# Patient Record
Sex: Female | Born: 1975 | Race: Black or African American | Hispanic: No | Marital: Married | Smoking: Never smoker
Health system: Southern US, Community
[De-identification: ages and names within clinical notes are randomized; demographics above are authoritative.]

## PROBLEM LIST (undated history)

## (undated) DIAGNOSIS — M1990 Unspecified osteoarthritis, unspecified site: Secondary | ICD-10-CM

## (undated) DIAGNOSIS — E7800 Pure hypercholesterolemia, unspecified: Secondary | ICD-10-CM

## (undated) DIAGNOSIS — I82409 Acute embolism and thrombosis of unspecified deep veins of unspecified lower extremity: Secondary | ICD-10-CM

## (undated) DIAGNOSIS — M545 Low back pain, unspecified: Secondary | ICD-10-CM

## (undated) DIAGNOSIS — D649 Anemia, unspecified: Secondary | ICD-10-CM

## (undated) DIAGNOSIS — G971 Other reaction to spinal and lumbar puncture: Secondary | ICD-10-CM

## (undated) DIAGNOSIS — M5431 Sciatica, right side: Secondary | ICD-10-CM

## (undated) DIAGNOSIS — I6990 Unspecified sequelae of unspecified cerebrovascular disease: Secondary | ICD-10-CM

## (undated) DIAGNOSIS — Z8669 Personal history of other diseases of the nervous system and sense organs: Secondary | ICD-10-CM

## (undated) DIAGNOSIS — K5904 Chronic idiopathic constipation: Secondary | ICD-10-CM

## (undated) DIAGNOSIS — K469 Unspecified abdominal hernia without obstruction or gangrene: Secondary | ICD-10-CM

## (undated) DIAGNOSIS — N921 Excessive and frequent menstruation with irregular cycle: Secondary | ICD-10-CM

## (undated) DIAGNOSIS — G8929 Other chronic pain: Secondary | ICD-10-CM

## (undated) DIAGNOSIS — R51 Headache: Secondary | ICD-10-CM

## (undated) DIAGNOSIS — M5126 Other intervertebral disc displacement, lumbar region: Secondary | ICD-10-CM

## (undated) DIAGNOSIS — I639 Cerebral infarction, unspecified: Secondary | ICD-10-CM

## (undated) DIAGNOSIS — K219 Gastro-esophageal reflux disease without esophagitis: Secondary | ICD-10-CM

## (undated) HISTORY — PX: ENDOMETRIAL ABLATION: SHX621

## (undated) HISTORY — PX: TONSILLECTOMY: SUR1361

---

## 1999-06-12 HISTORY — PX: ANTERIOR CRUCIATE LIGAMENT REPAIR: SHX115

## 2001-12-30 ENCOUNTER — Encounter: Payer: Self-pay | Admitting: *Deleted

## 2001-12-30 ENCOUNTER — Emergency Department (HOSPITAL_COMMUNITY): Admission: EM | Admit: 2001-12-30 | Discharge: 2001-12-30 | Payer: Self-pay | Admitting: *Deleted

## 2004-03-09 ENCOUNTER — Ambulatory Visit (HOSPITAL_COMMUNITY): Admission: RE | Admit: 2004-03-09 | Discharge: 2004-03-09 | Payer: Self-pay | Admitting: Family Medicine

## 2005-03-07 ENCOUNTER — Ambulatory Visit: Payer: Self-pay | Admitting: Family Medicine

## 2005-04-10 ENCOUNTER — Ambulatory Visit: Payer: Self-pay | Admitting: Family Medicine

## 2005-07-23 ENCOUNTER — Ambulatory Visit: Payer: Self-pay | Admitting: Family Medicine

## 2005-11-02 ENCOUNTER — Ambulatory Visit: Payer: Self-pay | Admitting: Family Medicine

## 2009-10-20 ENCOUNTER — Ambulatory Visit (HOSPITAL_COMMUNITY): Admission: RE | Admit: 2009-10-20 | Discharge: 2009-10-20 | Payer: Self-pay | Admitting: Family Medicine

## 2010-10-09 ENCOUNTER — Encounter (INDEPENDENT_AMBULATORY_CARE_PROVIDER_SITE_OTHER): Payer: Self-pay | Admitting: Obstetrics & Gynecology

## 2010-10-09 ENCOUNTER — Other Ambulatory Visit: Payer: Self-pay | Admitting: Obstetrics & Gynecology

## 2010-10-09 DIAGNOSIS — Z01419 Encounter for gynecological examination (general) (routine) without abnormal findings: Secondary | ICD-10-CM

## 2010-10-09 DIAGNOSIS — N938 Other specified abnormal uterine and vaginal bleeding: Secondary | ICD-10-CM

## 2010-10-09 DIAGNOSIS — N920 Excessive and frequent menstruation with regular cycle: Secondary | ICD-10-CM

## 2010-10-10 NOTE — Group Therapy Note (Signed)
NAMEAUREA, ARONOV                 ACCOUNT NO.:  [id number]  MEDICAL RECORD NO.:  [id number]           PATIENT TYPE:  A  LOCATION:  [facility]                   FACILITY:  [facility]  PHYSICIAN:  Allie Bossier, MD        DATE OF BIRTH:  06-Nov-1975  DATE OF SERVICE:  10/09/2010                                 CLINIC NOTE  Ms. Mattox is a 35 year old married black G4, P3, A1.  She has children ages 34, 63, and 36, and she is a self-referral here for dysmenorrhea and heavy cycles.  She says that her period began at age 26 and has been always heavy.  Her periods last anywhere from 4-6 days, but the outstandingly better periods is that she has "tremendous clots."  She says that she only wears pads that she wears the extra large overnight pads even during the day and will have to change 6-7 times per day.  PAST MEDICAL HISTORY:  Significant for, 1. Morbid obesity. 2. Reflux. 3. Umbilical hernia. 4. Hemorrhoids.  REVIEW OF SYSTEMS:  She has been married for 8 years.  Her last Pap smear was in 2010.  She has never had a mammogram.  She denies dyspareunia.  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  PAST SURGICAL HISTORY:  Tubal ligation, C-section x2, elective AB, ACL repair of her right knee.  FAMILY HISTORY:  Negative for breast and GYN malignancies.  A grandfather had colon cancer and of note her sister had fibroids.  No known drug allergies.  No latex allergies.  MEDICATION:  Over-the-counter omeprazole daily, vitamin B12 daily, Tylenol, Midol, and ibuprofen on a p.r.n. basis.  PHYSICAL EXAMINATION:  GENERAL:  Well-nourished, well-hydrated very pleasant female. VITAL SIGNS:  Height 6 feet 2 inches, weight 327 pounds, blood pressure 115/62, pulse 73, afebrile. HEENT:  Normal. BREASTS:  Pendulous.  No masses.  No nipple discharges, skin changes, or lymphadenopathy. ABDOMEN:  Morbidly obese.  She has transverse incision from her  C- sections.  She has chronic yeast under her pannus. EXTERNAL GENITALIA:  No lesions.  Cervix, she has what appears to be a cervical fibroid on the anterior lip of the cervix.  Her os appears normal.  Bimanual exam reveals a 14-16 weeks size, lumpy uterus consistent with fibroids.  Her adnexae are not palpable.  ASSESSMENT/PLAN: 1. Annual exam.  I have checked Pap smear and we will schedule     mammogram.  I have recommend self-breast and self-vulvar exams. 2. Menorrhagia.  I would suspect this is due to her fibroids as well     as her dysmenorrhea.  I am ordering an ultrasound along with CBC to     assess her level of anemia as well as a TSH and cervical cultures.     She will come back when these results are available and we will     base our treatment on the information obtained.     Allie Bossier, MD    MCD/MEDQ  D:  10/09/2010  T:  10/10/2010  Job:  [id number]

## 2010-10-18 ENCOUNTER — Other Ambulatory Visit (HOSPITAL_COMMUNITY): Payer: Self-pay

## 2010-10-18 ENCOUNTER — Ambulatory Visit (HOSPITAL_COMMUNITY)
Admission: RE | Admit: 2010-10-18 | Discharge: 2010-10-18 | Disposition: A | Discharge status: Home / Self Care | Payer: Medicaid Other | Source: Ambulatory Visit | Attending: Obstetrics & Gynecology | Admitting: Obstetrics & Gynecology

## 2010-10-18 DIAGNOSIS — N920 Excessive and frequent menstruation with regular cycle: Secondary | ICD-10-CM

## 2010-10-18 DIAGNOSIS — N938 Other specified abnormal uterine and vaginal bleeding: Secondary | ICD-10-CM

## 2010-11-15 ENCOUNTER — Ambulatory Visit: Payer: Medicaid Other | Admitting: Obstetrics and Gynecology

## 2010-11-15 DIAGNOSIS — N920 Excessive and frequent menstruation with regular cycle: Secondary | ICD-10-CM

## 2010-11-15 DIAGNOSIS — N949 Unspecified condition associated with female genital organs and menstrual cycle: Secondary | ICD-10-CM

## 2010-11-16 NOTE — Group Therapy Note (Signed)
[name], [name] NO.:  [id number]  MEDICAL RECORD NO.:  [id number]           PATIENT TYPE:  A  LOCATION[facility]  [facility]                   FACILITY:  [facility]  PHYSICIAN:  Catalina Antigua, MD     DATE OF BIRTH:  [birth year]  DATE OF SERVICE:  11/15/2010                                 CLINIC NOTE  This is a 35 year old gravida 4, para 3-0-1-3 who presents today to discuss the results of her pelvic ultrasound.  performed on Oct 18, 2010, for evaluation of possible etiology of menorrhagia, results of the ultrasounds were reviewed with the patient which revealed a uterus measuring 11 x 6 x 7.5 cm.  No fibroids or other uterine masses were identified.  She had an endometrial lining of 13 mm transvaginally, homogeneous in appearance.  The right and left ovary were not visualized and no adnexal masses were identified.  The results were explained to the patient, and the patient verbalized understanding, and reported a complaint of Kelly Torres pelvic pain.  She states that the pain is Kelly Torres, throbbing in nature with no alleviating factors and seems to intensify with her period.  The patient gives a history of occasional constipation and her pelvic pain does not seem to the a decrease following bowel movement.  The possibility of endometriosis was discussed with the patient, it was explained to the patient that definitive diagnosis will be achieved by diagnostic laparoscopy.  The patient was not interested in surgical intervention at this time, and medical options for management of presumed endometriosis were discussed. The use of oral contraceptive pills Depo-Provera and Mirena IUD.  The patient was interested in the Mirena IUD but declines insertion today. The patient will come for return as scheduled for Mirena IUD insertion at her convenience.  In the meantime, the patient states that her pain is relatively well controlled with ibuprofen and we will continue that regimen.   Results of her Pap smear were also explained to the patient.          ______________________________ Catalina Antigua, MD    PC/MEDQ  D:  11/15/2010  T:  11/16/2010  Job:  [id number]

## 2011-01-12 ENCOUNTER — Emergency Department (HOSPITAL_COMMUNITY)
Admission: EM | Admit: 2011-01-12 | Discharge: 2011-01-12 | Disposition: A | Discharge status: Home / Self Care | Payer: Medicaid Other | Attending: Emergency Medicine | Admitting: Emergency Medicine

## 2011-01-12 DIAGNOSIS — M549 Dorsalgia, unspecified: Secondary | ICD-10-CM | POA: Insufficient documentation

## 2011-01-12 DIAGNOSIS — R209 Unspecified disturbances of skin sensation: Secondary | ICD-10-CM | POA: Insufficient documentation

## 2011-01-12 MED ORDER — DEXAMETHASONE SODIUM PHOSPHATE 4 MG/ML IJ SOLN
8.0000 mg | Freq: Once | INTRAMUSCULAR | Status: AC
  Administered 2011-01-12: 8 mg via INTRAMUSCULAR

## 2011-01-12 MED ORDER — HYDROCODONE-ACETAMINOPHEN 5-325 MG PO TABS: ORAL_TABLET | ORAL | Status: DC

## 2011-01-12 MED ORDER — METHOCARBAMOL 500 MG PO TABS: ORAL_TABLET | ORAL | Status: DC

## 2011-01-12 MED ORDER — DEXAMETHASONE 6 MG PO TABS: 6.0000 mg | ORAL_TABLET | Freq: Two times a day (BID) | ORAL | Status: AC

## 2011-01-12 MED ORDER — HYDROCODONE-ACETAMINOPHEN 5-325 MG PO TABS
2.0000 | ORAL_TABLET | Freq: Once | ORAL | Status: AC
  Administered 2011-01-12: 2 via ORAL

## 2011-01-12 MED ORDER — DIAZEPAM 5 MG PO TABS
5.0000 mg | ORAL_TABLET | Freq: Once | ORAL | Status: AC
  Administered 2011-01-12: 5 mg via ORAL

## 2011-01-12 NOTE — ED Notes (Signed)
RIGHT LOW BACK PAIN RADIATING DOWN BUTTOCKS TO FOOT. WITH NUMBNESS TO BIG TOE

## 2011-01-12 NOTE — ED Provider Notes (Signed)
History     CSN: [id number] Arrival date & time: 01/12/2011  8:03 PM  Chief Complaint  Patient presents with  . Back Pain   Patient is a 35 y.o. female presenting with back pain. The history is provided by the patient.  Back Pain  This is a new problem. The current episode started more than 1 week ago. The problem occurs daily. The problem has not changed since onset.The pain is associated with no known injury. The pain is present in the lumbar spine. The quality of the pain is described as aching. The pain radiates to the right foot. The pain is at a severity of 8/10. The pain is severe. Exacerbated by: sitting. The pain is the same all the time. Associated symptoms include numbness. Pertinent negatives include no chest pain, no fever, no abdominal pain, no bowel incontinence, no bladder incontinence, no dysuria and no weakness. She has tried analgesics for the symptoms. The treatment provided no relief. Risk factors include obesity.    No past medical history on file.  No past surgical history on file.  No family history on file.  History  Substance Use Topics  . Smoking status: Not on file  . Smokeless tobacco: Not on file  . Alcohol Use: Not on file    OB History    No data available      Review of Systems  Constitutional: Positive for activity change. Negative for fever and appetite change.       All ROS Neg except as noted in HPI  HENT: Negative for nosebleeds and neck pain.   Eyes: Negative for photophobia and discharge.  Respiratory: Negative for cough, shortness of breath and wheezing.   Cardiovascular: Negative for chest pain and palpitations.  Gastrointestinal: Negative for abdominal pain, blood in stool and bowel incontinence.  Genitourinary: Negative for bladder incontinence, dysuria, frequency and hematuria.  Musculoskeletal: Positive for back pain. Negative for arthralgias.  Skin: Negative.   Neurological: Positive for numbness. Negative for dizziness,  seizures, speech difficulty and weakness.  Psychiatric/Behavioral: Negative for hallucinations and confusion.    Physical Exam  BP 100/62  Pulse 72  Temp(Src) 98.1 F (36.7 C) (Oral)  Wt 322 lb (146.058 kg)  SpO2 100%  Physical Exam  Nursing note and vitals reviewed. Constitutional: She is oriented to person, place, and time. She appears well-developed and well-nourished.  Non-toxic appearance.  HENT:  Head: Normocephalic.  Right Ear: Tympanic membrane and external ear normal.  Left Ear: Tympanic membrane and external ear normal.  Eyes: EOM and lids are normal. Pupils are equal, round, and reactive to light.  Neck: Normal range of motion. Neck supple. Carotid bruit is not present.  Cardiovascular: Normal rate, regular rhythm, normal heart sounds, intact distal pulses and normal pulses.   Pulmonary/Chest: Breath sounds normal. No respiratory distress.  Abdominal: Soft. Bowel sounds are normal. There is no tenderness. There is no guarding.  Musculoskeletal: She exhibits no edema and no tenderness.       Lumbar spine area pain to palpation and to Attempted ROM.  Lymphadenopathy:       Head (right side): No submandibular adenopathy present.       Head (left side): No submandibular adenopathy present.    She has no cervical adenopathy.  Neurological: She is alert and oriented to person, place, and time. She has normal strength. No cranial nerve deficit or sensory deficit.  Skin: Skin is warm and dry.  Psychiatric: She has a normal mood and affect. Her speech  is normal.    ED Course  Procedures  MDM I have reviewed nursing notes, vital signs, and all appropriate lab and imaging results for this patient.      Kathie Dike, [place] 01/12/11 2103

## 2011-01-22 ENCOUNTER — Ambulatory Visit (HOSPITAL_COMMUNITY)
Admission: RE | Admit: 2011-01-22 | Discharge: 2011-01-22 | Disposition: A | Discharge status: Home / Self Care | Payer: Medicaid Other | Source: Ambulatory Visit | Attending: Physical Medicine and Rehabilitation | Admitting: Physical Medicine and Rehabilitation

## 2011-01-22 ENCOUNTER — Other Ambulatory Visit (HOSPITAL_COMMUNITY): Payer: Self-pay | Admitting: Physical Medicine and Rehabilitation

## 2011-01-22 DIAGNOSIS — M545 Low back pain, unspecified: Secondary | ICD-10-CM

## 2011-01-22 DIAGNOSIS — M79609 Pain in unspecified limb: Secondary | ICD-10-CM | POA: Insufficient documentation

## 2011-01-22 DIAGNOSIS — M51379 Other intervertebral disc degeneration, lumbosacral region without mention of lumbar back pain or lower extremity pain: Secondary | ICD-10-CM | POA: Insufficient documentation

## 2011-01-22 DIAGNOSIS — M5137 Other intervertebral disc degeneration, lumbosacral region: Secondary | ICD-10-CM | POA: Insufficient documentation

## 2011-01-23 ENCOUNTER — Other Ambulatory Visit (HOSPITAL_COMMUNITY): Payer: Self-pay | Admitting: Physical Medicine and Rehabilitation

## 2011-01-23 DIAGNOSIS — M545 Low back pain, unspecified: Secondary | ICD-10-CM

## 2011-01-23 DIAGNOSIS — M79604 Pain in right leg: Secondary | ICD-10-CM

## 2011-01-24 ENCOUNTER — Ambulatory Visit (HOSPITAL_COMMUNITY)
Admission: RE | Admit: 2011-01-24 | Discharge: 2011-01-24 | Disposition: A | Discharge status: Home / Self Care | Payer: Medicaid Other | Source: Ambulatory Visit | Attending: Physical Medicine and Rehabilitation | Admitting: Physical Medicine and Rehabilitation

## 2011-01-24 DIAGNOSIS — M51379 Other intervertebral disc degeneration, lumbosacral region without mention of lumbar back pain or lower extremity pain: Secondary | ICD-10-CM | POA: Insufficient documentation

## 2011-01-24 DIAGNOSIS — M5137 Other intervertebral disc degeneration, lumbosacral region: Secondary | ICD-10-CM | POA: Insufficient documentation

## 2011-01-24 DIAGNOSIS — M545 Low back pain, unspecified: Secondary | ICD-10-CM | POA: Insufficient documentation

## 2011-01-24 DIAGNOSIS — M79604 Pain in right leg: Secondary | ICD-10-CM

## 2011-01-24 DIAGNOSIS — M5126 Other intervertebral disc displacement, lumbar region: Secondary | ICD-10-CM | POA: Insufficient documentation

## 2011-03-14 ENCOUNTER — Ambulatory Visit (HOSPITAL_COMMUNITY)
Admission: RE | Admit: 2011-03-14 | Discharge: 2011-03-14 | Disposition: A | Discharge status: Home / Self Care | Payer: Medicaid Other | Source: Ambulatory Visit | Attending: Physical Medicine and Rehabilitation | Admitting: Physical Medicine and Rehabilitation

## 2011-03-14 ENCOUNTER — Other Ambulatory Visit (HOSPITAL_COMMUNITY): Payer: Self-pay | Admitting: Physical Medicine and Rehabilitation

## 2011-03-14 DIAGNOSIS — M25569 Pain in unspecified knee: Secondary | ICD-10-CM

## 2011-10-12 ENCOUNTER — Other Ambulatory Visit: Payer: Self-pay | Admitting: Obstetrics and Gynecology

## 2011-10-29 ENCOUNTER — Other Ambulatory Visit: Payer: Self-pay | Admitting: Obstetrics & Gynecology

## 2011-10-30 ENCOUNTER — Encounter (HOSPITAL_COMMUNITY): Payer: Self-pay

## 2011-10-30 ENCOUNTER — Encounter (HOSPITAL_COMMUNITY)
Admission: RE | Admit: 2011-10-30 | Discharge: 2011-10-30 | Disposition: A | Discharge status: Home / Self Care | Payer: Medicaid Other | Source: Ambulatory Visit | Attending: Obstetrics and Gynecology | Admitting: Obstetrics and Gynecology

## 2011-10-30 HISTORY — DX: Chronic idiopathic constipation: K59.04

## 2011-10-30 HISTORY — DX: Excessive and frequent menstruation with irregular cycle: N92.1

## 2011-10-30 HISTORY — DX: Other intervertebral disc displacement, lumbar region: M51.26

## 2011-10-30 HISTORY — DX: Unspecified osteoarthritis, unspecified site: M19.90

## 2011-10-30 HISTORY — DX: Other reaction to spinal and lumbar puncture: G97.1

## 2011-10-30 HISTORY — DX: Gastro-esophageal reflux disease without esophagitis: K21.9

## 2011-10-30 HISTORY — DX: Anemia, unspecified: D64.9

## 2011-10-30 HISTORY — DX: Personal history of other diseases of the nervous system and sense organs: Z86.69

## 2011-10-30 LAB — COMPREHENSIVE METABOLIC PANEL
ALT: 11 U/L (ref 0–35)
AST: 18 U/L (ref 0–37)
Albumin: 3.4 g/dL — ABNORMAL LOW (ref 3.5–5.2)
Alkaline Phosphatase: 38 U/L — ABNORMAL LOW (ref 39–117)
BUN: 13 mg/dL (ref 6–23)
CO2: 24 mEq/L (ref 19–32)
Calcium: 8.8 mg/dL (ref 8.4–10.5)
Chloride: 106 mEq/L (ref 96–112)
Creatinine, Ser: 0.76 mg/dL (ref 0.50–1.10)
GFR calc Af Amer: >90 mL/min (ref >90)
GFR calc non Af Amer: >90 mL/min (ref >90)
Glucose, Bld: 75 mg/dL (ref 70–99)
Potassium: 3.7 mEq/L (ref 3.5–5.1)
Sodium: 138 mEq/L (ref 135–145)
Total Bilirubin: 0.6 mg/dL (ref 0.3–1.2)
Total Protein: 6.5 g/dL (ref 6.0–8.3)

## 2011-10-30 LAB — CBC
HCT: 33.8 % — ABNORMAL LOW (ref 36.0–46.0)
Hemoglobin: 10.9 g/dL — ABNORMAL LOW (ref 12.0–15.0)
MCH: 26 pg (ref 26.0–34.0)
MCHC: 32.2 g/dL (ref 30.0–36.0)
MCV: 80.5 fL (ref 78.0–100.0)
Platelets: 220 10*3/uL (ref 150–400)
RBC: 4.2 MIL/uL (ref 3.87–5.11)
RDW: 16.1 % — ABNORMAL HIGH (ref 11.5–15.5)
WBC: 5.5 10*3/uL (ref 4.0–10.5)

## 2011-10-30 LAB — URINALYSIS, ROUTINE W REFLEX MICROSCOPIC
Bilirubin Urine: NEGATIVE
Glucose, UA: NEGATIVE mg/dL
Hgb urine dipstick: NEGATIVE
Ketones, ur: NEGATIVE mg/dL
Leukocytes, UA: NEGATIVE
Nitrite: NEGATIVE
Protein, ur: NEGATIVE mg/dL
Specific Gravity, Urine: 1.005 (ref 1.005–1.030)
Urobilinogen, UA: 0.2 mg/dL (ref 0.0–1.0)
pH: 7 (ref 5.0–8.0)

## 2011-10-30 LAB — SURGICAL PCR SCREEN
MRSA, PCR: NEGATIVE
Staphylococcus aureus: NEGATIVE

## 2011-10-30 LAB — HCG, QUANTITATIVE, PREGNANCY: hCG, Beta Chain, Quant, S: <1 m[IU]/mL (ref <5)

## 2011-10-30 NOTE — Patient Instructions (Signed)
18 Kelly Torres  10/30/2011   Your procedure is scheduled on:  Tuesday, 11/06/11  Report to [facility] at 1030 AM.  Call this number if you have problems the morning of surgery: [phone]   Remember:   Do not eat food:After Midnight.  May have clear liquids:until Midnight .  Clear liquids include soda, tea, black coffee, apple or grape juice, broth.  Take these medicines the morning of surgery with A SIP OF WATER: prilosec and robaxin if needed   Do not wear jewelry, make-up or nail polish.  Do not wear lotions, powders, or perfumes. You may wear deodorant.  Do not shave 48 hours prior to surgery. Men may shave face and neck.  Do not bring valuables to the hospital.  Contacts, dentures or bridgework may not be worn into surgery.  Leave suitcase in the car. After surgery it may be brought to your room.  For patients admitted to the hospital, checkout time is 11:00 AM the day of discharge.   Patients discharged the day of surgery will not be allowed to drive home.  Name and phone number of your driver: family  Special Instructions: CHG Shower Use Special Wash: 1/2 bottle night before surgery and 1/2 bottle morning of surgery.   Please read over the following fact sheets that you were given: Pain Booklet, MRSA Information, Surgical Site Infection Prevention, Anesthesia Post-op Instructions and Care and Recovery After Surgery   PATIENT INSTRUCTIONS POST-ANESTHESIA  IMMEDIATELY FOLLOWING SURGERY:  Do not drive or operate machinery for the first twenty four hours after surgery.  Do not make any important decisions for twenty four hours after surgery or while taking narcotic pain medications or sedatives.  If you develop intractable nausea and vomiting or a severe headache please notify your doctor immediately.  FOLLOW-UP:  Please make an appointment with your surgeon as instructed. You do not need to follow up with anesthesia unless specifically instructed to do so.  WOUND CARE  INSTRUCTIONS (if applicable):  Keep a dry clean dressing on the anesthesia/puncture wound site if there is drainage.  Once the wound has quit draining you may leave it open to air.  Generally you should leave the bandage intact for twenty four hours unless there is drainage.  If the epidural site drains for more than 36-48 hours please call the anesthesia department.  QUESTIONS?:  Please feel free to call your physician or the hospital operator if you have any questions, and they will be happy to assist you.         Endometrial Ablation Endometrial ablation removes the lining of the uterus (endometrium). It is usually a same day, outpatient treatment. Ablation helps avoid major surgery (such as a hysterectomy). A hysterectomy is removal of the cervix and uterus. Endometrial ablation has less risk and complications, has a shorter recovery period and is less expensive. After endometrial ablation, most women will have little or no menstrual bleeding. You may not keep your fertility. Pregnancy is no longer likely after this procedure but if you are pre-menopausal, you still need to use a reliable method of birth control following the procedure because pregnancy can occur. REASONS TO HAVE THE PROCEDURE MAY INCLUDE:  Heavy periods.   Bleeding that is causing anemia.   Anovulatory bleeding, very irregular, bleeding.   Bleeding submucous fibroids (on the lining inside the uterus) if they are smaller than 3 centimeters.  REASONS NOT TO HAVE THE PROCEDURE MAY INCLUDE:  You wish to have more children.   You  have a pre-cancerous or cancerous problem. The cause of any abnormal bleeding must be diagnosed before having the procedure.   You have pain coming from the uterus.   You have a submucus fibroid larger than 3 centimeters.   You recently had a baby.   You recently had an infection in the uterus.   You have a severe retro-flexed, tipped uterus and cannot insert the instrument to do the  ablation.   You had a Cesarean section or deep major surgery on the uterus.   The inner cavity of the uterus is too large for the endometrial ablation instrument.  RISKS AND COMPLICATIONS   Perforation of the uterus.   Bleeding.   Infection of the uterus, bladder or vagina.   Injury to surrounding organs.   Cutting the cervix.   An air bubble to the lung (air embolus).   Pregnancy following the procedure.   Failure of the procedure to help the problem requiring hysterectomy.   Decreased ability to diagnose cancer in the lining of the uterus.  BEFORE THE PROCEDURE  The lining of the uterus must be tested to make sure there is no pre-cancerous or cancer cells present.   Medications may be given to make the lining of the uterus thinner.   Ultrasound may be used to evaluate the size and look for abnormalities of the uterus.   Future pregnancy is not desired.  PROCEDURE  There are different ways to destroy the lining of the uterus.   Resectoscope - radio frequency-alternating electric current is the most common one used.   Cryotherapy - freezing the lining of the uterus.   Heated Free Liquid - heated salt (saline) solution inserted into the uterus.   Microwave - uses high energy microwaves in the uterus.   Thermal Balloon - a catheter with a balloon tip is inserted into the uterus and filled with heated fluid.  Your caregiver will talk with you about the method used in this clinic. They will also instruct you on the pros and cons of the procedure. Endometrial ablation is performed along with a procedure called operative hysteroscopy. A narrow viewing tube is inserted through the birth canal (vagina) and through the cervix into the uterus. A tiny camera attached to the viewing tube (hysteroscope) allows the uterine cavity to be shown on a TV monitor during surgery. Your uterus is filled with a harmless liquid to make the procedure easier. The lining of the uterus is then  removed. The lining can also be removed with a resectoscope which allows your surgeon to cut away the lining of the uterus under direct vision. Usually, you will be able to go home within an hour after the procedure. HOME CARE INSTRUCTIONS   Do not drive for 24 hours.   No tampons, douching or intercourse for 2 weeks or until your caregiver approves.   Rest at home for 24 to 48 hours. You may then resume normal activities unless told differently by your caregiver.   Take your temperature two times a day for 4 days, and record it.   Take any medications your caregiver has ordered, as directed.   Use some form of contraception if you are pre-menopausal and do not want to get pregnant.  Bleeding after the procedure is normal. It varies from light spotting and mildly watery to bloody discharge for 4 to 6 weeks. You may also have mild cramping. Only take over-the-counter or prescription medicines for pain, discomfort, or fever as directed by your caregiver.  Do not use aspirin, as this may aggravate bleeding. Frequent urination during the first 24 hours is normal. You will not know how effective your surgery is until at least 3 months after the surgery. SEEK IMMEDIATE MEDICAL CARE IF:   Bleeding is heavier than a normal menstrual cycle.   An oral temperature above 102 F (38.9 C) develops.   You have increasing cramps or pains not relieved with medication or develop belly (abdominal) pain which does not seem to be related to the same area of earlier cramping and pain.   You are light headed, weak or have fainting episodes.   You develop pain in the shoulder strap areas.   You have chest or leg pain.   You have abnormal vaginal discharge.   You have painful urination.  Document Released: 04/06/2004 Document Revised: 05/17/2011 Document Reviewed: 07/05/2007 [facility] Patient Information 2012 [place], [place].

## 2011-11-05 NOTE — H&P (Signed)
Kelly Torres is an 36 y.o. female. Who is admitted for hysteroscopy D&C and endometrial ablation through [facility] day surgery.her LMP was May 23 and is just ending. She referred to our facility for addressing her heavy periods with menses causing mild anemia.she has previously declined to consider an IUD to control menses. She had had a tubal sterilization in the past at the time of last cesarean section.  Pertinent Gynecological History: Menses: flow is excessive with use of both pads or tampons on heaviest days Bleeding: denies intermenstrual bleeding Contraception: tubal ligation DES exposure: unknown Blood transfusions: none Sexually transmitted diseases: no past history Previous GYN Procedures: cesarean section x2 with tubal ligation  Last mammogram: none required at her age Date:  Last pap: normal Date: 2012 health department OB History: G4, P3013   Menstrual History: Menarche age: 7 No LMP recorded.  11/01/2011 LMP  Past Medical History  Diagnosis Date  . Hx of migraines     last 08/2011  . Lumbar herniated disc     pain radiates down right leg  . GERD (gastroesophageal reflux disease)   . Constipation - functional   . Arthritis     back  . Menometrorrhagia   . Anemia   . Spinal headache     Past Surgical History  Procedure Date  . Tonsillectomy age 62  . Anterior cruciate ligament repair 2001    right knee, Dr. Romeo Apple, [facility]  . Cesarean section 2002, 2005    [place]    No family history on file.  Social History:  reports that she has never smoked. She does not have any smokeless tobacco history on file. She reports that she does not drink alcohol or use illicit drugs.  Allergies:  Allergies  Allergen Reactions  . Latex Rash    No prescriptions prior to admission    Review of Systems  Constitutional: Negative.   HENT: Negative.   Eyes: Negative.   Respiratory: Negative.   Cardiovascular: Negative.   Gastrointestinal: Negative.     Genitourinary: Negative for dysuria and frequency.       LMP last Thursday.5/23    There were no vitals taken for this visit. Physical Exam  Constitutional: She is oriented to person, place, and time. She appears well-developed and well-nourished.  HENT:  Head: Normocephalic.  Eyes: EOM are normal. Pupils are equal, round, and reactive to light.  Neck: Neck supple.  Cardiovascular: Normal rate.   Respiratory: Effort normal.  GI: Soft.  Genitourinary: Vagina normal.       Uterus mildly enlarged on ultrasound 11/2010, without fibroids.Patient declined use of IUD to control menses in 2012 .  Neurological: She is alert and oriented to person, place, and time.  Psychiatric: She has a normal mood and affect. Her behavior is normal. Thought content normal.   CBC    Component Value Date/Time   WBC 5.5 10/30/2011 1130   RBC 4.20 10/30/2011 1130   HGB 10.9* 10/30/2011 1130   HCT 33.8* 10/30/2011 1130   PLT 220 10/30/2011 1130   MCV 80.5 10/30/2011 1130   MCH 26.0 10/30/2011 1130   MCHC 32.2 10/30/2011 1130   RDW 16.1* 10/30/2011 1130     No results found for this or any previous visit (from the past 24 hour(s)).  No results found.  Assessment/Plan: Menorrhagia, desiring correction by endometrial ablation, after discussion of alternative therapies.  Kelly Torres V 11/05/2011, 9:43 PM

## 2011-11-06 ENCOUNTER — Ambulatory Visit (HOSPITAL_COMMUNITY): Payer: Medicaid Other | Admitting: Anesthesiology

## 2011-11-06 ENCOUNTER — Encounter (HOSPITAL_COMMUNITY)
Admission: RE | Disposition: A | Discharge status: Home / Self Care | Payer: Self-pay | Source: Ambulatory Visit | Attending: Obstetrics and Gynecology

## 2011-11-06 ENCOUNTER — Ambulatory Visit (HOSPITAL_COMMUNITY)
Admission: RE | Admit: 2011-11-06 | Discharge: 2011-11-06 | Disposition: A | Discharge status: Home / Self Care | Payer: Medicaid Other | Source: Ambulatory Visit | Attending: Obstetrics and Gynecology | Admitting: Obstetrics and Gynecology

## 2011-11-06 ENCOUNTER — Encounter (HOSPITAL_COMMUNITY): Payer: Self-pay | Admitting: Anesthesiology

## 2011-11-06 ENCOUNTER — Encounter (HOSPITAL_COMMUNITY): Payer: Self-pay | Admitting: *Deleted

## 2011-11-06 DIAGNOSIS — Z01812 Encounter for preprocedural laboratory examination: Secondary | ICD-10-CM | POA: Insufficient documentation

## 2011-11-06 DIAGNOSIS — N920 Excessive and frequent menstruation with regular cycle: Secondary | ICD-10-CM | POA: Diagnosis present

## 2011-11-06 SURGERY — DILATATION & CURETTAGE/HYSTEROSCOPY WITH THERMACHOICE ABLATION
Anesthesia: General | Wound class: Clean Contaminated

## 2011-11-06 MED ORDER — MIDAZOLAM HCL 2 MG/2ML IJ SOLN
INTRAMUSCULAR | Status: AC
  Filled 2011-11-06: qty 2

## 2011-11-06 MED ORDER — CEFAZOLIN SODIUM 1-5 GM-% IV SOLN
INTRAVENOUS | Status: AC
  Filled 2011-11-06: qty 50

## 2011-11-06 MED ORDER — OXYCODONE-ACETAMINOPHEN 5-325 MG PO TABS
2.0000 | ORAL_TABLET | Freq: Once | ORAL | Status: AC
  Administered 2011-11-06: 2 via ORAL

## 2011-11-06 MED ORDER — GLYCOPYRROLATE 0.2 MG/ML IJ SOLN
INTRAMUSCULAR | Status: DC | PRN
  Administered 2011-11-06: 0.4 mg via INTRAVENOUS

## 2011-11-06 MED ORDER — MIDAZOLAM HCL 2 MG/2ML IJ SOLN
INTRAMUSCULAR | Status: AC
  Administered 2011-11-06: 2 mg via INTRAVENOUS
  Filled 2011-11-06: qty 2

## 2011-11-06 MED ORDER — CEFAZOLIN SODIUM-DEXTROSE 2-3 GM-% IV SOLR: 2.0000 g | INTRAVENOUS | Status: DC

## 2011-11-06 MED ORDER — LIDOCAINE HCL 1 % IJ SOLN
INTRAMUSCULAR | Status: DC | PRN
  Administered 2011-11-06: 50 mg via INTRADERMAL

## 2011-11-06 MED ORDER — LIDOCAINE HCL (PF) 1 % IJ SOLN
INTRAMUSCULAR | Status: AC
  Filled 2011-11-06: qty 5

## 2011-11-06 MED ORDER — FENTANYL CITRATE 0.05 MG/ML IJ SOLN
25.0000 ug | INTRAMUSCULAR | Status: DC | PRN
  Administered 2011-11-06: 50 ug via INTRAVENOUS

## 2011-11-06 MED ORDER — OXYCODONE-ACETAMINOPHEN 5-325 MG PO TABS
ORAL_TABLET | ORAL | Status: AC
  Administered 2011-11-06: 2 via ORAL
  Filled 2011-11-06: qty 2

## 2011-11-06 MED ORDER — LACTATED RINGERS IV SOLN
INTRAVENOUS | Status: DC
  Administered 2011-11-06: 12:00:00 via INTRAVENOUS
  Administered 2011-11-06: 1000 mL via INTRAVENOUS

## 2011-11-06 MED ORDER — CEFAZOLIN SODIUM 1-5 GM-% IV SOLN: 1.0000 g | INTRAVENOUS | Status: DC

## 2011-11-06 MED ORDER — BUPIVACAINE-EPINEPHRINE PF 0.5-1:200000 % IJ SOLN
INTRAMUSCULAR | Status: AC
  Filled 2011-11-06: qty 10

## 2011-11-06 MED ORDER — 0.9 % SODIUM CHLORIDE (POUR BTL) OPTIME
TOPICAL | Status: DC | PRN
  Administered 2011-11-06: 1000 mL

## 2011-11-06 MED ORDER — GLYCOPYRROLATE 0.2 MG/ML IJ SOLN
INTRAMUSCULAR | Status: AC
  Filled 2011-11-06: qty 2

## 2011-11-06 MED ORDER — ROCURONIUM BROMIDE 50 MG/5ML IV SOLN
INTRAVENOUS | Status: AC
  Filled 2011-11-06: qty 1

## 2011-11-06 MED ORDER — KETOROLAC TROMETHAMINE 30 MG/ML IJ SOLN
INTRAMUSCULAR | Status: AC
  Administered 2011-11-06: 30 mg via INTRAVENOUS
  Filled 2011-11-06: qty 1

## 2011-11-06 MED ORDER — FENTANYL CITRATE 0.05 MG/ML IJ SOLN
INTRAMUSCULAR | Status: DC | PRN
  Administered 2011-11-06 (×3): 50 ug via INTRAVENOUS
  Administered 2011-11-06: 100 ug via INTRAVENOUS

## 2011-11-06 MED ORDER — KETOROLAC TROMETHAMINE 10 MG PO TABS: 10.0000 mg | ORAL_TABLET | Freq: Four times a day (QID) | ORAL | Status: AC | PRN

## 2011-11-06 MED ORDER — PROPOFOL 10 MG/ML IV BOLUS
INTRAVENOUS | Status: DC | PRN
  Administered 2011-11-06: 160 mg via INTRAVENOUS

## 2011-11-06 MED ORDER — BUPIVACAINE-EPINEPHRINE 0.5% -1:200000 IJ SOLN
INTRAMUSCULAR | Status: DC | PRN
  Administered 2011-11-06: 19 mL

## 2011-11-06 MED ORDER — ROCURONIUM BROMIDE 100 MG/10ML IV SOLN
INTRAVENOUS | Status: DC | PRN
  Administered 2011-11-06: 35 mg via INTRAVENOUS
  Administered 2011-11-06: 5 mg via INTRAVENOUS

## 2011-11-06 MED ORDER — DEXTROSE 5 % IV SOLN
INTRAVENOUS | Status: DC | PRN
  Administered 2011-11-06: 20 mL via INTRAVENOUS

## 2011-11-06 MED ORDER — KETOROLAC TROMETHAMINE 30 MG/ML IJ SOLN
30.0000 mg | Freq: Once | INTRAMUSCULAR | Status: AC
  Administered 2011-11-06: 30 mg via INTRAVENOUS

## 2011-11-06 MED ORDER — MIDAZOLAM HCL 2 MG/2ML IJ SOLN
1.0000 mg | INTRAMUSCULAR | Status: DC | PRN
  Administered 2011-11-06 (×2): 2 mg via INTRAVENOUS

## 2011-11-06 MED ORDER — PROPOFOL 10 MG/ML IV EMUL
INTRAVENOUS | Status: AC
  Filled 2011-11-06: qty 20

## 2011-11-06 MED ORDER — ONDANSETRON HCL 4 MG/2ML IJ SOLN
4.0000 mg | Freq: Once | INTRAMUSCULAR | Status: AC
Start: 2011-11-06 — End: 2011-11-06
  Administered 2011-11-06: 4 mg via INTRAVENOUS

## 2011-11-06 MED ORDER — ONDANSETRON HCL 4 MG/2ML IJ SOLN: 4.0000 mg | Freq: Once | INTRAMUSCULAR | Status: DC | PRN

## 2011-11-06 MED ORDER — ONDANSETRON HCL 4 MG/2ML IJ SOLN
INTRAMUSCULAR | Status: AC
  Administered 2011-11-06: 4 mg via INTRAVENOUS
  Filled 2011-11-06: qty 2

## 2011-11-06 MED ORDER — ATROPINE SULFATE 1 MG/ML IJ SOLN
INTRAMUSCULAR | Status: AC
  Filled 2011-11-06: qty 1

## 2011-11-06 MED ORDER — CEFAZOLIN SODIUM-DEXTROSE 2-3 GM-% IV SOLR
INTRAVENOUS | Status: AC
  Filled 2011-11-06: qty 50

## 2011-11-06 MED ORDER — NEOSTIGMINE METHYLSULFATE 1 MG/ML IJ SOLN
INTRAMUSCULAR | Status: DC | PRN
  Administered 2011-11-06: 3 mg via INTRAVENOUS

## 2011-11-06 MED ORDER — FENTANYL CITRATE 0.05 MG/ML IJ SOLN
INTRAMUSCULAR | Status: AC
  Filled 2011-11-06: qty 2

## 2011-11-06 MED ORDER — OXYCODONE-ACETAMINOPHEN 10-325 MG PO TABS: 1.0000 | ORAL_TABLET | ORAL | Status: AC | PRN

## 2011-11-06 MED ORDER — KETOROLAC TROMETHAMINE 10 MG PO TABS: 10.0000 mg | ORAL_TABLET | Freq: Four times a day (QID) | ORAL | Status: DC | PRN

## 2011-11-06 MED ORDER — FENTANYL CITRATE 0.05 MG/ML IJ SOLN
INTRAMUSCULAR | Status: AC
  Administered 2011-11-06: 50 ug via INTRAVENOUS
  Filled 2011-11-06: qty 5

## 2011-11-06 MED ORDER — SODIUM CHLORIDE 0.9 % IR SOLN
Status: DC | PRN
  Administered 2011-11-06: 3000 mL

## 2011-11-06 MED ORDER — CEFAZOLIN SODIUM 1-5 GM-% IV SOLN
INTRAVENOUS | Status: DC | PRN
  Administered 2011-11-06: 2 g via INTRAVENOUS

## 2011-11-06 MED ORDER — MIDAZOLAM HCL 5 MG/5ML IJ SOLN
INTRAMUSCULAR | Status: DC | PRN
  Administered 2011-11-06: 2 mg via INTRAVENOUS

## 2011-11-06 SURGICAL SUPPLY — 29 items
BAG DECANTER FOR FLEXI CONT (MISCELLANEOUS) ×2 IMPLANT
BAG HAMPER (MISCELLANEOUS) ×2 IMPLANT
CATH ROBINSON RED A/P 16FR (CATHETERS) ×1 IMPLANT
CATH THERMACHOICE III (CATHETERS) ×2 IMPLANT
CLOTH BEACON ORANGE TIMEOUT ST (SAFETY) ×2 IMPLANT
COVER LIGHT HANDLE STERIS (MISCELLANEOUS) ×4 IMPLANT
COVER MAYO STAND XLG (DRAPE) ×1 IMPLANT
FORMALIN 10 PREFIL 480ML (MISCELLANEOUS) ×1 IMPLANT
GLOVE ECLIPSE 9.0 STRL (GLOVE) ×1 IMPLANT
GLOVE EXAM NITRILE MD LF STRL (GLOVE) ×2 IMPLANT
GLOVE INDICATOR 7.0 STRL GRN (GLOVE) ×2 IMPLANT
GLOVE INDICATOR STER SZ 9 (GLOVE) ×2 IMPLANT
GLOVE SKINSENSE NS SZ6.5 (GLOVE) ×2
GLOVE SKINSENSE STRL SZ6.5 (GLOVE) IMPLANT
GOWN STRL REIN 3XL LVL4 (GOWN DISPOSABLE) ×1 IMPLANT
GOWN STRL REIN XL XLG (GOWN DISPOSABLE) ×2 IMPLANT
INST SET HYSTEROSCOPY (KITS) ×2 IMPLANT
IV D5W 500ML (IV SOLUTION) ×2 IMPLANT
IV NS IRRIG 3000ML ARTHROMATIC (IV SOLUTION) ×2 IMPLANT
KIT ROOM TURNOVER AP CYSTO (KITS) ×2 IMPLANT
MANIFOLD NEPTUNE II (INSTRUMENTS) ×2 IMPLANT
MANIFOLD NEPTUNE WASTE (CANNULA) ×2 IMPLANT
NS IRRIG 1000ML POUR BTL (IV SOLUTION) ×2 IMPLANT
PACK PERI GYN (CUSTOM PROCEDURE TRAY) ×2 IMPLANT
PAD ARMBOARD 7.5X6 YLW CONV (MISCELLANEOUS) ×2 IMPLANT
PAD TELFA 3X4 1S STER (GAUZE/BANDAGES/DRESSINGS) ×2 IMPLANT
SET BASIN LINEN APH (SET/KITS/TRAYS/PACK) ×2 IMPLANT
SET IRRIG Y TYPE TUR BLADDER L (SET/KITS/TRAYS/PACK) ×4 IMPLANT
SYR CONTROL 10ML LL (SYRINGE) ×2 IMPLANT

## 2011-11-06 NOTE — Anesthesia Preprocedure Evaluation (Signed)
Anesthesia Evaluation  Patient identified by MRN, date of birth, ID band Patient awake    Reviewed: Allergy & Precautions, H&P , NPO status , Patient's Chart, lab work & pertinent test results  History of Anesthesia Complications (+) POST - OP SPINAL HEADACHE  Airway Mallampati: II TM Distance: >3 FB Neck ROM: Full    Dental  (+) Teeth Intact   Pulmonary neg pulmonary ROS,  breath sounds clear to auscultation        Cardiovascular negative cardio ROS  Rhythm:Regular Rate:Normal     Neuro/Psych  Headaches,    GI/Hepatic GERD-  Medicated,  Endo/Other  Morbid obesity  Renal/GU      Musculoskeletal   Abdominal (+) + obese,   Peds  Hematology  (+) Blood dyscrasia, anemia ,   Anesthesia Other Findings   Reproductive/Obstetrics                           Anesthesia Physical Anesthesia Plan  ASA: II  Anesthesia Plan: General   Post-op Pain Management:    Induction: Intravenous, Rapid sequence and Cricoid pressure planned  Airway Management Planned: Oral ETT  Additional Equipment:   Intra-op Plan:   Post-operative Plan: Extubation in OR  Informed Consent: I have reviewed the patients History and Physical, chart, labs and discussed the procedure including the risks, benefits and alternatives for the proposed anesthesia with the patient or authorized representative who has indicated his/her understanding and acceptance.     Plan Discussed with:   Anesthesia Plan Comments:         Anesthesia Quick Evaluation

## 2011-11-06 NOTE — Interval H&P Note (Signed)
History and Physical Interval Note:  11/06/2011 10:30 AM  Kelly Torres  has presented today for surgery, with the diagnosis of menometrorrhagia anemia secondary to blood loss  The various methods of treatment have been discussed with the patient and family. After consideration of risks, benefits and other options for treatment, the patient has consented to  Procedure(s) (LRB): DILATATION & CURETTAGE/HYSTEROSCOPY WITH THERMACHOICE ABLATION (N/A) as a surgical intervention .  The patients' history has been reviewed, patient examined, no change in status, stable for surgery.  I have reviewed the patients' chart and labs.  Questions were answered to the patient's satisfaction.    The patient and I discussed her case last night. There are no changes in patient plan or patient condition since this dictation.   Tilda Burrow

## 2011-11-06 NOTE — Brief Op Note (Signed)
11/06/2011  11:50 AM  PATIENT:  Kelly Torres  36 y.o. female  PRE-OPERATIVE DIAGNOSIS:  menometrorrhagia  POST-OPERATIVE DIAGNOSIS:  menometrorrhagia  PROCEDURE:  Procedure(s) (LRB): DILATATION & CURETTAGE/HYSTEROSCOPY WITH THERMACHOICE ABLATION (N/A)  SURGEON:  Surgeon(s) and Role:    * Tilda Burrow, MD - Primary  PHYSICIAN ASSISTANT:   ASSISTANTS: none   ANESTHESIA:   general and paracervical block  EBL:  Total I/O In: 1000 [I.V.:1000] Out: -   BLOOD ADMINISTERED:none  DRAINS: none   LOCAL MEDICATIONS USED:  MARCAINE    and Amount: 20 ml  SPECIMEN:  No Specimen  DISPOSITION OF SPECIMEN:  N/A  COUNTS:  YES  TOURNIQUET:  * No tourniquets in log *  DICTATION: .Dragon Dictation Patient was taken to the operating room prepped and draped for vaginal procedure. Timeout was conducted and procedure confirmed by surgical team. Speculum was inserted and cervix grasped with single-tooth tenaculum. The cervix was noted to have an old obstetric laceration at 2:00 that reached to the cervicovaginal fornix . Uterus was anteflexed could be sounded to 13 cm was dilated to 23 [place] and the 30 rigid operative hysteroscope used to visualize the endometrial cavity. There was active menstrual bleeding present. The endometrial contours could be visualized and appeared to be denuded consistent with menstrual cycle day 5. Endometrial ablation was considered appropriate. The Gynecare ThermaChoice 3 endometrial ablation device was then prepaired ,inserted,, filled with 20 cc of D5W and the 8 minute thermal ablation sequence completed. All 20 cc was recovered of the D5W. Paracervical block using 20 cc of Marcaine with epinephrine was then performed at 3,5.,7 and 9:00, and in the procedure considered successfully completed. Patient went to recovery in stable condition without difficulty. sponge and needle counts correct. PLAN OF CARE: Discharge to home after PACU  PATIENT DISPOSITION:  PACU -  hemodynamically stable.   Delay start of Pharmacological VTE agent (>24hrs) due to surgical blood loss or risk of bleeding: not applicable

## 2011-11-06 NOTE — Anesthesia Procedure Notes (Signed)
Procedure Name: Intubation Date/Time: 11/06/2011 10:48 AM Performed by: Despina Hidden Pre-anesthesia Checklist: Emergency Drugs available, Patient identified, Suction available and Patient being monitored Patient Re-evaluated:Patient Re-evaluated prior to inductionOxygen Delivery Method: Circle system utilized Preoxygenation: Pre-oxygenation with 100% oxygen Intubation Type: IV induction and Cricoid Pressure applied Ventilation: Mask ventilation without difficulty Laryngoscope Size: Mac and 3 Grade View: Grade I Tube type: Oral Number of attempts: 1 Airway Equipment and Method: Stylet Placement Confirmation: ETT inserted through vocal cords under direct vision,  positive ETCO2 and breath sounds checked- equal and bilateral Secured at: 22 cm Tube secured with: Tape Dental Injury: Teeth and Oropharynx as per pre-operative assessment

## 2011-11-06 NOTE — Anesthesia Postprocedure Evaluation (Signed)
  Anesthesia Post-op Note  Patient: Kelly Torres  Procedure(s) Performed: Procedure(s) (LRB): DILATATION & CURETTAGE/HYSTEROSCOPY WITH THERMACHOICE ABLATION (N/A)  Patient Location: PACU  Anesthesia Type: General  Level of Consciousness: awake, alert , oriented and patient cooperative  Airway and Oxygen Therapy: Patient Spontanous Breathing  Post-op Pain: 2 /10, mild  Post-op Assessment: Post-op Vital signs reviewed, Patient's Cardiovascular Status Stable, Respiratory Function Stable, Patent Airway, No signs of Nausea or vomiting and Pain level controlled  Post-op Vital Signs: Reviewed and stable  Complications: No apparent anesthesia complications

## 2011-11-06 NOTE — Op Note (Signed)
See the operative details included in the brief operative note 

## 2011-11-06 NOTE — Transfer of Care (Signed)
Immediate Anesthesia Transfer of Care Note  Patient: Kelly Torres  Procedure(s) Performed: Procedure(s) (LRB): DILATATION & CURETTAGE/HYSTEROSCOPY WITH THERMACHOICE ABLATION (N/A)  Patient Location: PACU  Anesthesia Type: General  Level of Consciousness: awake and patient cooperative  Airway & Oxygen Therapy: Patient Spontanous Breathing and Patient connected to face mask oxygen  Post-op Assessment: Report given to PACU RN, Post -op Vital signs reviewed and stable and Patient moving all extremities  Post vital signs: Reviewed and stable  Complications: No apparent anesthesia complications

## 2011-12-07 ENCOUNTER — Other Ambulatory Visit: Payer: Self-pay | Admitting: Neurosurgery

## 2011-12-24 ENCOUNTER — Encounter (HOSPITAL_COMMUNITY): Payer: Self-pay | Admitting: Pharmacy Technician

## 2011-12-29 NOTE — Pre-Procedure Instructions (Addendum)
20 Kelly Torres  12/29/2011   Your procedure is scheduled on:  Wed, July 31 @ 8:30 AM  Report to [facility] at 5:30 AM.(as instructed by office)  Call this number if you have problems the morning of surgery: [phone]   Remember:   Do not eat food or drink:After Midnight.    Take these medicines the morning of surgery with A SIP OF WATER: Pain Pill(if needed),Pantoprazole(Protonix),and Omeprazole(Prilosec) stop voltaren, any aspirin, or other nsaids 01/02/12   Do not wear jewelry, make-up or nail polish.  Do not wear lotions, powders, or perfumes.   Do not shave 48 hours prior to surgery.   Do not bring valuables to the hospital.  Contacts, dentures or bridgework may not be worn into surgery.  Leave suitcase in the car. After surgery it may be brought to your room.  For patients admitted to the hospital, checkout time is 11:00 AM the day of discharge.   Patients discharged the day of surgery will not be allowed to drive home.  Contact person tina [phone]  Special Instructions: CHG Shower Use Special Wash: 1/2 bottle night before surgery and 1/2 bottle morning of surgery.   Please read over the following fact sheets that you were given: Pain Booklet, Coughing and Deep Breathing, MRSA Information and Surgical Site Infection Prevention

## 2011-12-31 ENCOUNTER — Encounter (HOSPITAL_COMMUNITY)
Admission: RE | Admit: 2011-12-31 | Discharge: 2011-12-31 | Disposition: A | Discharge status: Home / Self Care | Payer: Medicaid Other | Source: Ambulatory Visit | Attending: Neurosurgery | Admitting: Neurosurgery

## 2011-12-31 ENCOUNTER — Encounter (HOSPITAL_COMMUNITY): Payer: Self-pay

## 2011-12-31 HISTORY — DX: Unspecified abdominal hernia without obstruction or gangrene: K46.9

## 2011-12-31 LAB — CBC
HCT: 34.5 % — ABNORMAL LOW (ref 36.0–46.0)
Hemoglobin: 10.9 g/dL — ABNORMAL LOW (ref 12.0–15.0)
MCH: 24.4 pg — ABNORMAL LOW (ref 26.0–34.0)
MCHC: 31.6 g/dL (ref 30.0–36.0)
MCV: 77.2 fL — ABNORMAL LOW (ref 78.0–100.0)
Platelets: 238 10*3/uL (ref 150–400)
RBC: 4.47 MIL/uL (ref 3.87–5.11)
RDW: 16.5 % — ABNORMAL HIGH (ref 11.5–15.5)
WBC: 5 10*3/uL (ref 4.0–10.5)

## 2011-12-31 LAB — SURGICAL PCR SCREEN
MRSA, PCR: NEGATIVE
Staphylococcus aureus: NEGATIVE

## 2011-12-31 LAB — HCG, SERUM, QUALITATIVE: Preg, Serum: NEGATIVE

## 2012-01-09 MED ORDER — CEFAZOLIN SODIUM 1-5 GM-% IV SOLN: 1.0000 g | INTRAVENOUS | Status: DC

## 2012-01-09 MED ORDER — DEXTROSE 5 % IV SOLN
3.0000 g | INTRAVENOUS | Status: DC
  Filled 2012-01-09: qty 3000

## 2012-01-09 MED ORDER — DEXTROSE 5 % IV SOLN
3.0000 g | INTRAVENOUS | Status: AC
  Administered 2012-01-10: 3 g via INTRAVENOUS
  Filled 2012-01-09: qty 3000

## 2012-01-10 ENCOUNTER — Ambulatory Visit (HOSPITAL_COMMUNITY): Payer: Medicaid Other | Admitting: Anesthesiology

## 2012-01-10 ENCOUNTER — Encounter (HOSPITAL_COMMUNITY): Payer: Self-pay | Admitting: Anesthesiology

## 2012-01-10 ENCOUNTER — Ambulatory Visit (HOSPITAL_COMMUNITY)
Admission: RE | Admit: 2012-01-10 | Discharge: 2012-01-11 | DRG: 491 | Disposition: A | Discharge status: Home / Self Care | Payer: Medicaid Other | Source: Ambulatory Visit | Attending: Neurosurgery | Admitting: Neurosurgery

## 2012-01-10 ENCOUNTER — Ambulatory Visit (HOSPITAL_COMMUNITY): Payer: Medicaid Other

## 2012-01-10 ENCOUNTER — Encounter (HOSPITAL_COMMUNITY)
Admission: RE | Disposition: A | Discharge status: Home / Self Care | Payer: Self-pay | Source: Ambulatory Visit | Attending: Neurosurgery

## 2012-01-10 DIAGNOSIS — K219 Gastro-esophageal reflux disease without esophagitis: Secondary | ICD-10-CM | POA: Diagnosis present

## 2012-01-10 DIAGNOSIS — M5126 Other intervertebral disc displacement, lumbar region: Secondary | ICD-10-CM | POA: Diagnosis present

## 2012-01-10 DIAGNOSIS — Z91040 Latex allergy status: Secondary | ICD-10-CM

## 2012-01-10 DIAGNOSIS — Z9089 Acquired absence of other organs: Secondary | ICD-10-CM

## 2012-01-10 HISTORY — PX: LUMBAR LAMINECTOMY/DECOMPRESSION MICRODISCECTOMY: SHX5026

## 2012-01-10 SURGERY — LUMBAR LAMINECTOMY/DECOMPRESSION MICRODISCECTOMY 1 LEVEL
Anesthesia: General | Laterality: Right | Wound class: Clean

## 2012-01-10 MED ORDER — MIDAZOLAM HCL 5 MG/5ML IJ SOLN
INTRAMUSCULAR | Status: DC | PRN
  Administered 2012-01-10: 2 mg via INTRAVENOUS

## 2012-01-10 MED ORDER — DIAZEPAM 5 MG PO TABS
5.0000 mg | ORAL_TABLET | Freq: Four times a day (QID) | ORAL | Status: DC | PRN
  Administered 2012-01-10 – 2012-01-11 (×2): 5 mg via ORAL
  Filled 2012-01-10 (×2): qty 1

## 2012-01-10 MED ORDER — ONDANSETRON HCL 4 MG/2ML IJ SOLN
INTRAMUSCULAR | Status: DC | PRN
  Administered 2012-01-10: 4 mg via INTRAVENOUS

## 2012-01-10 MED ORDER — ZOLPIDEM TARTRATE 5 MG PO TABS: 5.0000 mg | ORAL_TABLET | Freq: Every evening | ORAL | Status: DC | PRN

## 2012-01-10 MED ORDER — DOCUSATE SODIUM 100 MG PO CAPS
100.0000 mg | ORAL_CAPSULE | Freq: Two times a day (BID) | ORAL | Status: DC
  Administered 2012-01-10 – 2012-01-11 (×2): 100 mg via ORAL
  Filled 2012-01-10 (×2): qty 1

## 2012-01-10 MED ORDER — LIDOCAINE HCL (CARDIAC) 20 MG/ML IV SOLN
INTRAVENOUS | Status: DC | PRN
  Administered 2012-01-10: 100 mg via INTRAVENOUS

## 2012-01-10 MED ORDER — BACITRACIN ZINC 500 UNIT/GM EX OINT
TOPICAL_OINTMENT | CUTANEOUS | Status: DC | PRN
  Administered 2012-01-10: 1 via TOPICAL

## 2012-01-10 MED ORDER — MENTHOL 3 MG MT LOZG: 1.0000 | LOZENGE | OROMUCOSAL | Status: DC | PRN

## 2012-01-10 MED ORDER — ACETAMINOPHEN 650 MG RE SUPP: 650.0000 mg | RECTAL | Status: DC | PRN

## 2012-01-10 MED ORDER — ONDANSETRON HCL 4 MG/2ML IJ SOLN: 4.0000 mg | INTRAMUSCULAR | Status: DC | PRN

## 2012-01-10 MED ORDER — ACETAMINOPHEN 325 MG PO TABS: 650.0000 mg | ORAL_TABLET | ORAL | Status: DC | PRN

## 2012-01-10 MED ORDER — ONDANSETRON HCL 4 MG/2ML IJ SOLN
4.0000 mg | Freq: Once | INTRAMUSCULAR | Status: AC | PRN
  Administered 2012-01-10: 4 mg via INTRAVENOUS

## 2012-01-10 MED ORDER — PANTOPRAZOLE SODIUM 40 MG PO TBEC: 40.0000 mg | DELAYED_RELEASE_TABLET | Freq: Every day | ORAL | Status: DC

## 2012-01-10 MED ORDER — DICLOFENAC SODIUM 25 MG PO TBEC
25.0000 mg | DELAYED_RELEASE_TABLET | Freq: Two times a day (BID) | ORAL | Status: DC
  Filled 2012-01-10 (×4): qty 1

## 2012-01-10 MED ORDER — BUPIVACAINE-EPINEPHRINE PF 0.5-1:200000 % IJ SOLN
INTRAMUSCULAR | Status: DC | PRN
  Administered 2012-01-10: 20 mL

## 2012-01-10 MED ORDER — SODIUM CHLORIDE 0.9 % IJ SOLN: 3.0000 mL | INTRAMUSCULAR | Status: DC | PRN

## 2012-01-10 MED ORDER — PHENOL 1.4 % MT LIQD: 1.0000 | OROMUCOSAL | Status: DC | PRN

## 2012-01-10 MED ORDER — HYDROMORPHONE HCL PF 1 MG/ML IJ SOLN
0.2500 mg | INTRAMUSCULAR | Status: DC | PRN
  Administered 2012-01-10 (×2): 0.5 mg via INTRAVENOUS

## 2012-01-10 MED ORDER — FENTANYL CITRATE 0.05 MG/ML IJ SOLN
INTRAMUSCULAR | Status: DC | PRN
  Administered 2012-01-10: 50 ug via INTRAVENOUS
  Administered 2012-01-10: 150 ug via INTRAVENOUS
  Administered 2012-01-10: 50 ug via INTRAVENOUS
  Administered 2012-01-10: 75 ug via INTRAVENOUS

## 2012-01-10 MED ORDER — LACTATED RINGERS IV SOLN
INTRAVENOUS | Status: DC
  Administered 2012-01-10: 17:00:00 via INTRAVENOUS

## 2012-01-10 MED ORDER — HEMOSTATIC AGENTS (NO CHARGE) OPTIME
TOPICAL | Status: DC | PRN
  Administered 2012-01-10: 1 via TOPICAL

## 2012-01-10 MED ORDER — SODIUM CHLORIDE 0.9 % IV SOLN
INTRAVENOUS | Status: AC
  Filled 2012-01-10: qty 500

## 2012-01-10 MED ORDER — OXYCODONE-ACETAMINOPHEN 5-325 MG PO TABS
1.0000 | ORAL_TABLET | ORAL | Status: DC | PRN
  Administered 2012-01-10 – 2012-01-11 (×3): 2 via ORAL
  Filled 2012-01-10 (×3): qty 2

## 2012-01-10 MED ORDER — SODIUM CHLORIDE 0.9 % IJ SOLN
3.0000 mL | Freq: Two times a day (BID) | INTRAMUSCULAR | Status: DC
  Administered 2012-01-10: 3 mL via INTRAVENOUS

## 2012-01-10 MED ORDER — SODIUM CHLORIDE 0.9 % IR SOLN
Status: DC | PRN
  Administered 2012-01-10: 12:00:00

## 2012-01-10 MED ORDER — PROPOFOL 10 MG/ML IV EMUL
INTRAVENOUS | Status: DC | PRN
  Administered 2012-01-10: 150 mg via INTRAVENOUS
  Administered 2012-01-10: 50 mg via INTRAVENOUS

## 2012-01-10 MED ORDER — HYDROMORPHONE HCL PF 1 MG/ML IJ SOLN
INTRAMUSCULAR | Status: AC
  Administered 2012-01-10: 0.5 mg via INTRAVENOUS
  Filled 2012-01-10: qty 1

## 2012-01-10 MED ORDER — GLYCOPYRROLATE 0.2 MG/ML IJ SOLN
INTRAMUSCULAR | Status: DC | PRN
  Administered 2012-01-10: 0.6 mg via INTRAVENOUS

## 2012-01-10 MED ORDER — HYDROCODONE-ACETAMINOPHEN 5-325 MG PO TABS: 1.0000 | ORAL_TABLET | ORAL | Status: DC | PRN

## 2012-01-10 MED ORDER — ONDANSETRON HCL 4 MG/2ML IJ SOLN
INTRAMUSCULAR | Status: AC
  Administered 2012-01-10: 4 mg via INTRAVENOUS
  Filled 2012-01-10: qty 2

## 2012-01-10 MED ORDER — NEOSTIGMINE METHYLSULFATE 1 MG/ML IJ SOLN
INTRAMUSCULAR | Status: DC | PRN
  Administered 2012-01-10: 4 mg via INTRAVENOUS

## 2012-01-10 MED ORDER — DEXAMETHASONE SODIUM PHOSPHATE 4 MG/ML IJ SOLN
INTRAMUSCULAR | Status: DC | PRN
  Administered 2012-01-10: 8 mg via INTRAVENOUS

## 2012-01-10 MED ORDER — BACITRACIN 50000 UNITS IM SOLR
INTRAMUSCULAR | Status: AC
  Filled 2012-01-10: qty 1

## 2012-01-10 MED ORDER — THROMBIN 5000 UNITS EX SOLR
CUTANEOUS | Status: DC | PRN
  Administered 2012-01-10 (×2): 5000 [IU] via TOPICAL

## 2012-01-10 MED ORDER — ROCURONIUM BROMIDE 100 MG/10ML IV SOLN
INTRAVENOUS | Status: DC | PRN
  Administered 2012-01-10: 50 mg via INTRAVENOUS
  Administered 2012-01-10 (×2): 10 mg via INTRAVENOUS

## 2012-01-10 MED ORDER — DEXTROSE 5 % IV SOLN
INTRAVENOUS | Status: DC | PRN
  Administered 2012-01-10: 12:00:00 via INTRAVENOUS

## 2012-01-10 MED ORDER — MORPHINE SULFATE 2 MG/ML IJ SOLN
1.0000 mg | INTRAMUSCULAR | Status: DC | PRN
  Administered 2012-01-10 – 2012-01-11 (×3): 2 mg via INTRAVENOUS
  Filled 2012-01-10 (×3): qty 1

## 2012-01-10 MED ORDER — LACTATED RINGERS IV SOLN
INTRAVENOUS | Status: DC | PRN
  Administered 2012-01-10 (×2): via INTRAVENOUS

## 2012-01-10 MED ORDER — CEFAZOLIN SODIUM-DEXTROSE 2-3 GM-% IV SOLR
2.0000 g | Freq: Three times a day (TID) | INTRAVENOUS | Status: AC
  Administered 2012-01-10 – 2012-01-11 (×2): 2 g via INTRAVENOUS
  Filled 2012-01-10 (×2): qty 50

## 2012-01-10 MED ORDER — 0.9 % SODIUM CHLORIDE (POUR BTL) OPTIME
TOPICAL | Status: DC | PRN
  Administered 2012-01-10: 1000 mL

## 2012-01-10 SURGICAL SUPPLY — 56 items
APL SKNCLS STERI-STRIP NONHPOA (GAUZE/BANDAGES/DRESSINGS) ×1
BAG DECANTER FOR FLEXI CONT (MISCELLANEOUS) ×2 IMPLANT
BENZOIN TINCTURE PRP APPL 2/3 (GAUZE/BANDAGES/DRESSINGS) ×2 IMPLANT
BLADE SURG ROTATE 9660 (MISCELLANEOUS) IMPLANT
BRUSH SCRUB EZ PLAIN DRY (MISCELLANEOUS) ×2 IMPLANT
BUR ACORN 6.0 (BURR) ×2 IMPLANT
BUR MATCHSTICK NEURO 3.0 LAGG (BURR) ×2 IMPLANT
CANISTER SUCTION 2500CC (MISCELLANEOUS) ×2 IMPLANT
CLOTH BEACON ORANGE TIMEOUT ST (SAFETY) ×2 IMPLANT
CONT SPEC 4OZ CLIKSEAL STRL BL (MISCELLANEOUS) ×2 IMPLANT
DRAPE LAPAROTOMY 100X72X124 (DRAPES) ×2 IMPLANT
DRAPE MICROSCOPE LEICA (MISCELLANEOUS) ×2 IMPLANT
DRAPE POUCH INSTRU U-SHP 10X18 (DRAPES) ×2 IMPLANT
DRAPE SURG 17X23 STRL (DRAPES) ×8 IMPLANT
ELECT BLADE 4.0 EZ CLEAN MEGAD (MISCELLANEOUS) ×2
ELECT REM PT RETURN 9FT ADLT (ELECTROSURGICAL) ×2
ELECTRODE BLDE 4.0 EZ CLN MEGD (MISCELLANEOUS) ×1 IMPLANT
ELECTRODE REM PT RTRN 9FT ADLT (ELECTROSURGICAL) ×1 IMPLANT
GAUZE SPONGE 4X4 16PLY XRAY LF (GAUZE/BANDAGES/DRESSINGS) IMPLANT
GLOVE BIO SURGEON STRL SZ8.5 (GLOVE) ×2 IMPLANT
GLOVE BIOGEL M 8.0 STRL (GLOVE) ×1 IMPLANT
GLOVE BIOGEL PI IND STRL 7.5 (GLOVE) IMPLANT
GLOVE BIOGEL PI INDICATOR 7.5 (GLOVE) ×1
GLOVE ECLIPSE 7.5 STRL STRAW (GLOVE) ×2 IMPLANT
GLOVE EXAM NITRILE LRG STRL (GLOVE) IMPLANT
GLOVE EXAM NITRILE MD LF STRL (GLOVE) IMPLANT
GLOVE EXAM NITRILE XL STR (GLOVE) IMPLANT
GLOVE EXAM NITRILE XS STR PU (GLOVE) IMPLANT
GLOVE SS BIOGEL STRL SZ 8 (GLOVE) ×1 IMPLANT
GLOVE SUPERSENSE BIOGEL SZ 8 (GLOVE) ×1
GOWN BRE IMP SLV AUR LG STRL (GOWN DISPOSABLE) ×1 IMPLANT
GOWN BRE IMP SLV AUR XL STRL (GOWN DISPOSABLE) ×3 IMPLANT
GOWN STRL REIN 2XL LVL4 (GOWN DISPOSABLE) IMPLANT
KIT BASIN OR (CUSTOM PROCEDURE TRAY) ×2 IMPLANT
KIT ROOM TURNOVER OR (KITS) ×2 IMPLANT
NDL HYPO 21X1.5 SAFETY (NEEDLE) IMPLANT
NEEDLE HYPO 21X1.5 SAFETY (NEEDLE) IMPLANT
NEEDLE HYPO 22GX1.5 SAFETY (NEEDLE) ×2 IMPLANT
NS IRRIG 1000ML POUR BTL (IV SOLUTION) ×2 IMPLANT
PACK LAMINECTOMY NEURO (CUSTOM PROCEDURE TRAY) ×2 IMPLANT
PAD ARMBOARD 7.5X6 YLW CONV (MISCELLANEOUS) ×6 IMPLANT
PATTIES SURGICAL .5 X1 (DISPOSABLE) IMPLANT
RUBBERBAND STERILE (MISCELLANEOUS) ×4 IMPLANT
SPONGE GAUZE 4X4 12PLY (GAUZE/BANDAGES/DRESSINGS) ×2 IMPLANT
SPONGE SURGIFOAM ABS GEL SZ50 (HEMOSTASIS) ×2 IMPLANT
STRIP CLOSURE SKIN 1/2X4 (GAUZE/BANDAGES/DRESSINGS) ×2 IMPLANT
SUT VIC AB 1 CT1 18XBRD ANBCTR (SUTURE) ×1 IMPLANT
SUT VIC AB 1 CT1 8-18 (SUTURE) ×2
SUT VIC AB 2-0 CP2 18 (SUTURE) ×2 IMPLANT
SUT VIC AB 2-0 CT1 18 (SUTURE) IMPLANT
SYR 20CC LL (SYRINGE) IMPLANT
SYR 20ML ECCENTRIC (SYRINGE) ×2 IMPLANT
TAPE CLOTH SURG 4X10 WHT LF (GAUZE/BANDAGES/DRESSINGS) ×1 IMPLANT
TOWEL OR 17X24 6PK STRL BLUE (TOWEL DISPOSABLE) ×2 IMPLANT
TOWEL OR 17X26 10 PK STRL BLUE (TOWEL DISPOSABLE) ×2 IMPLANT
WATER STERILE IRR 1000ML POUR (IV SOLUTION) ×2 IMPLANT

## 2012-01-10 NOTE — Plan of Care (Signed)
Problem: Consults Goal: Diagnosis - Spinal Surgery Outcome: Completed/Met Date Met:  01/10/12 Lumbar Laminectomy (Complex)

## 2012-01-10 NOTE — Progress Notes (Signed)
Patient ID: Kelly Torres, female   DOB: [birth year], 36 y.o.   MRN: [id number] Subjective:  The patient is somnolent but easily arousable. She looks well. She is in no apparent stress.  Objective: Vital signs in last 24 hours: Temp:  [97.3 F (36.3 C)] 97.3 F (36.3 C) (08/01 1345) Pulse Rate:  [70-72] 70  (08/01 1345) Resp:  [18-22] 22  (08/01 1345) BP: (121-139)/(59-83) 121/59 mmHg (08/01 1345) SpO2:  [98 %-100 %] 100 % (08/01 1345)  Intake/Output from previous day:   Intake/Output this shift: Total I/O In: 1850 [I.V.:1850] Out: 1025 [Urine:1000; Blood:25]  Physical exam the patient is somnolent but is arousable. Her strength is normal in her bilateral gastrocnemius and EHL.  Lab Results: No results found for this basename: WBC:2,HGB:2,HCT:2,PLT:2 in the last 72 hours BMET No results found for this basename: NA:2,K:2,CL:2,CO2:2,GLUCOSE:2,BUN:2,CREATININE:2,CALCIUM:2 in the last 72 hours  Studies/Results: Dg Lumbar Spine 2-3 Views  01/10/2012  *RADIOLOGY REPORT*  Clinical Data: L4-5 microdiskectomy.  LUMBAR SPINE - 2-3 VIEW  Comparison: MRI 01/24/2011  Findings: First lateral intraoperative image demonstrates posterior instrument directed at the L2-3 level.  Second lateral intraoperative image demonstrates posterior surgical instruments posterior to the L4-5 level with a localizing instrument directed at the L5 vertebral body.  IMPRESSION: Intraoperative localization as above.  Original Report Authenticated By: Cyndie Chime, M.D.    Assessment/Plan: The patient is doing well.  LOS: 0 days     Anise Harbin D 01/10/2012, 2:02 PM

## 2012-01-10 NOTE — H&P (Signed)
Subjective: The patient is a 36 year old black female who has complained of back and right leg pain consistent with a lumbar radiculopathy. She has failed medical management and was worked up with a lumbar MRI. This demonstrated the patient had a herniated disc at L4-5 on the right. I discussed the various treatment options with the patient including surgery. The patient has weighed the risks, benefits, and alternatives surgery decided proceed with a right L4-5 discectomy.   Past Medical History  Diagnosis Date  . Hx of migraines     last 08/2011  . Lumbar herniated disc     pain radiates down right leg  . GERD (gastroesophageal reflux disease)   . Constipation - functional   . Arthritis     back  . Menometrorrhagia   . Spinal headache   . Hernia, abdominal   . Anemia     Past Surgical History  Procedure Date  . Tonsillectomy age 32  . Anterior cruciate ligament repair 2001    right knee, Dr. Romeo Apple, [facility]  . Cesarean section 2002, 2005    [place]  . Endometrial ablation     Allergies  Allergen Reactions  . Latex Rash    Allergic only to condoms, no problems with elastic in clothes, gloves etc    History  Substance Use Topics  . Smoking status: Never Smoker   . Smokeless tobacco: Not on file  . Alcohol Use: No    No family history on file. Prior to Admission medications   Medication Sig Start Date End Date Taking? Authorizing Provider  Cyanocobalamin (B-12) 1000 MCG CAPS Take 1 tablet by mouth daily.     Yes Historical Provider, MD  diclofenac (VOLTAREN) 0.1 % ophthalmic solution 1 drop 4 (four) times daily.   Yes Historical Provider, MD  diclofenac (VOLTAREN) 25 MG EC tablet Take 25 mg by mouth 2 (two) times daily.   Yes Historical Provider, MD  diclofenac sodium (VOLTAREN) 1 % GEL Apply 1 application topically daily as needed. For pain   Yes Historical Provider, MD  HYDROcodone-acetaminophen (LORCET) 10-650 MG per tablet Take 1 tablet by mouth every 6 (six) hours as  needed. For pain   Yes Historical Provider, MD  methocarbamol (ROBAXIN) 500 MG tablet Take 500 mg by mouth daily as needed. For muscle spasms 01/12/11  Yes Kathie Dike, PA  omeprazole (PRILOSEC) 40 MG capsule Take 40 mg by mouth daily.     Yes Historical Provider, MD  pantoprazole (PROTONIX) 40 MG tablet Take 40 mg by mouth daily.   Yes Historical Provider, MD  acetaminophen (TYLENOL) 650 MG CR tablet Take 650 mg by mouth every 8 (eight) hours as needed. For pain    Historical Provider, MD  diclofenac sodium (VOLTAREN) 1 % GEL Apply topically.    Historical Provider, MD     Review of Systems  Positive ROS: As above  All other systems have been reviewed and were otherwise negative with the exception of those mentioned in the HPI and as above.  Objective: Vital signs in last 24 hours: Temp:  [97.3 F (36.3 C)] 97.3 F (36.3 C) (08/01 0804) Pulse Rate:  [72] 72  (08/01 0804) Resp:  [18] 18  (08/01 0804) BP: (139)/(83) 139/83 mmHg (08/01 0804) SpO2:  [98 %] 98 % (08/01 0804)  General Appearance: Alert, cooperative, no distress, appears stated age Head: Normocephalic, without obvious abnormality, atraumatic Eyes: PERRL, conjunctiva/corneas clear, EOM's intact, fundi benign, both eyes      Ears: Normal TM's and  external ear canals, both ears Throat: Lips, mucosa, and tongue normal; teeth and gums normal Neck: Supple, symmetrical, trachea midline, no adenopathy; thyroid: No enlargement/tenderness/nodules; no carotid bruit or JVD Back: Symmetric, no curvature, ROM normal, no CVA tenderness Lungs: Clear to auscultation bilaterally, respirations unlabored Heart: Regular rate and rhythm, S1 and S2 normal, no murmur, rub or gallop Abdomen: Soft, non-tender, bowel sounds active all four quadrants, no masses, no organomegaly Extremities: Extremities normal, atraumatic, no cyanosis or edema Pulses: 2+ and symmetric all extremities Skin: Skin color, texture, turgor normal, no rashes or  lesions  NEUROLOGIC:   Mental status: alert and oriented, no aphasia, good attention span, Fund of knowledge/ memory ok Motor Exam - grossly normal except the patient has weakness in the right extensor hallucis longus/dorsiflexors. Sensory Exam - grossly normal Reflexes: Symmetric Coordination - grossly normal Gait - grossly normal Balance - grossly normal Cranial Nerves: I: smell Not tested  II: visual acuity  OS: Normal    OD: Normal   II: visual fields Full to confrontation  II: pupils Equal, round, reactive to light  III,VII: ptosis None  III,IV,VI: extraocular muscles  Full ROM  V: mastication Normal  V: facial light touch sensation  Normal  V,VII: corneal reflex  Present  VII: facial muscle function - upper  Normal  VII: facial muscle function - lower Normal  VIII: hearing Not tested  IX: soft palate elevation  Normal  IX,X: gag reflex Present  XI: trapezius strength  5/5  XI: sternocleidomastoid strength 5/5  XI: neck flexion strength  5/5  XII: tongue strength  Normal    Data Review Lab Results  Component Value Date   WBC 5.0 12/31/2011   HGB 10.9* 12/31/2011   HCT 34.5* 12/31/2011   MCV 77.2* 12/31/2011   PLT 238 12/31/2011   Lab Results  Component Value Date   NA 138 10/30/2011   K 3.7 10/30/2011   CL 106 10/30/2011   CO2 24 10/30/2011   BUN 13 10/30/2011   CREATININE 0.76 10/30/2011   GLUCOSE 75 10/30/2011   No results found for this basename: INR, PROTIME    Assessment/Plan: Right L4-5 herniated disc, lumbar radiculopathy, lumbago: I discussed situation with the patient. I reviewed her MR scan with her and pointed out the abnormalities. We have discussed the various treatment options including surgery. I described the surgical option of a right L4-5 discectomy. I've shown her surgical models. We have discussed the risks, benefits, alternatives and likelihood of achieving our goals with surgery. I have answered all the patient's questions. She wants to proceed  with the surgery.   Alisen Marsiglia D 01/10/2012 10:37 AM

## 2012-01-10 NOTE — Transfer of Care (Signed)
Immediate Anesthesia Transfer of Care Note  Patient: Kelly Torres  Procedure(s) Performed: Procedure(s) (LRB): LUMBAR LAMINECTOMY/DECOMPRESSION MICRODISCECTOMY 1 LEVEL (Right)  Patient Location: PACU  Anesthesia Type: General  Level of Consciousness: awake, alert , oriented and patient cooperative  Airway & Oxygen Therapy: Patient Spontanous Breathing and Patient connected to nasal cannula oxygen  Post-op Assessment: Report given to PACU RN and Post -op Vital signs reviewed and stable  Post vital signs: Reviewed and stable  Complications: No apparent anesthesia complications

## 2012-01-10 NOTE — Preoperative (Signed)
Beta Blockers   Reason not to administer Beta Blockers:Not Applicable 

## 2012-01-10 NOTE — Anesthesia Postprocedure Evaluation (Signed)
Anesthesia Post Note  Patient: Kelly Torres  Procedure(s) Performed: Procedure(s) (LRB): LUMBAR LAMINECTOMY/DECOMPRESSION MICRODISCECTOMY 1 LEVEL (Right)  Anesthesia type: general  Patient location: PACU  Post pain: Pain level controlled  Post assessment: Patient's Cardiovascular Status Stable  Last Vitals:  Filed Vitals:   01/10/12 1430  BP: 120/65  Pulse: 54  Temp:   Resp: 27    Post vital signs: Reviewed and stable  Level of consciousness: sedated  Complications: No apparent anesthesia complications

## 2012-01-10 NOTE — Anesthesia Preprocedure Evaluation (Addendum)
Anesthesia Evaluation  Patient identified by MRN, date of birth, ID band Patient awake    History of Anesthesia Complications (+) POST - OP SPINAL HEADACHE  Airway Mallampati: I TM Distance: >3 FB Neck ROM: Full    Dental  (+) Teeth Intact and Dental Advisory Given   Pulmonary neg pulmonary ROS,    Pulmonary exam normal       Cardiovascular negative cardio ROS      Neuro/Psych  Headaches, Anxiety    GI/Hepatic Neg liver ROS, GERD-  Medicated and Controlled,  Endo/Other    Renal/GU negative Renal ROS  negative genitourinary   Musculoskeletal  (+) Arthritis -, Osteoarthritis,    Abdominal   Peds  Hematology negative hematology ROS (+)   Anesthesia Other Findings   Reproductive/Obstetrics :Heavy Periods"                         Anesthesia Physical Anesthesia Plan  ASA: III  Anesthesia Plan: General   Post-op Pain Management:    Induction: Intravenous  Airway Management Planned: Oral ETT  Additional Equipment:   Intra-op Plan:   Post-operative Plan: Extubation in OR  Informed Consent: I have reviewed the patients History and Physical, chart, labs and discussed the procedure including the risks, benefits and alternatives for the proposed anesthesia with the patient or authorized representative who has indicated his/her understanding and acceptance.     Plan Discussed with: CRNA and Surgeon  Anesthesia Plan Comments:         Anesthesia Quick Evaluation

## 2012-01-10 NOTE — Op Note (Signed)
Brief history: The patient is a 36 year old black female who has suffered from back and right leg pain consistent with a right L5 radiculopathy. She has failed medical management and was worked up with lumbar MRI. This demonstrated a large herniated disc at L4-5 on the right. I discussed the various treatment options with the patient including surgery. The patient has weighed the risks, benefits, and alternatives surgery and decided proceed with a right L4-5 discectomy.  Preoperative diagnosis: Right L4-5 herniated disc, spinal stenosis, lumbar radiculopathy, lumbago  Postoperative diagnosis: The same  Procedure: Right L4-5 Intervertebral discectomy using micro-dissection  Surgeon: Dr. Delma Officer  Asst.: Dr. Hilda Lias  Anesthesia: Gen. endotracheal  Estimated blood loss: 50 cc  Drains: None  Complications: None  Description of procedure: The patient was brought to the operating room by the anesthesia team. General endotracheal anesthesia was induced. The patient was turned to the prone position on the Wilson frame. The patient's lumbosacral region was then prepared with Betadine scrub and Betadine solution. Sterile drapes were applied.  I then injected the area to be incised with Marcaine with epinephrine solution. I then used a scalpel to make a linear midline incision over the L4-5 intervertebral disc space. I then used electrocautery to perform a right sided subperiosteal dissection exposing the spinous process and lamina of L4 and L5. We obtained intraoperative radiograph to confirm our location. I then inserted the [facility] retractor for exposure.  We then brought the operative microscope into the field. Under its magnification and illumination we completed the microdissection. I used a high-speed drill to perform a laminotomy at L4. I then used a Kerrison punches to widen the laminotomy and removed the ligamentum flavum at L4-5. We then used microdissection to free up the  thecal sac and the right L5 nerve root from the epidural tissue. I then used a Kerrison punch to perform a foraminotomy at about the right L5 nerve root. We then using the nerve root retractor to gently retract the thecal sac and the L5 nerve root medially. This exposed the intervertebral disc. We identified the ruptured disc and remove it with the pituitary forceps. We performed a partial intervertebral sect me using the pituitary forceps and Epstein curettes.  I then palpated along the ventral surface of the thecal sac and along exit route of the right L5 nerve root and noted that the neural structures were well decompressed. This completed the decompression.  We then obtained hemostasis using bipolar electrocautery. We irrigated the wound out with bacitracin solution. We then removed the retractor. We then reapproximated the patient's thoracolumbar fascia with interrupted #1 Vicryl suture. We then reapproximated the patient's subcutaneous tissue with interrupted 3-0 Vicryl suture. We then reapproximated patient's skin with Steri-Strips and benzoin. The was then coated with bacitracin ointment. The drapes were removed. The patient was subsequently returned to the supine position where they were extubated by the anesthesia team. The patient was then transported to the postanesthesia care unit in stable condition. All sponge instrument and needle counts were correct at the end of this case.

## 2012-01-10 NOTE — Anesthesia Procedure Notes (Signed)
Procedure Name: Intubation Date/Time: 01/10/2012 11:30 AM Performed by: Fransisca Kaufmann Pre-anesthesia Checklist: Patient identified, Emergency Drugs available, Suction available, Patient being monitored and Timeout performed Patient Re-evaluated:Patient Re-evaluated prior to inductionOxygen Delivery Method: Circle system utilized Preoxygenation: Pre-oxygenation with 100% oxygen Intubation Type: IV induction Laryngoscope Size: Mac and 3 Grade View: Grade I Tube type: Oral Tube size: 7.5 mm Number of attempts: 1 Airway Equipment and Method: Stylet Placement Confirmation: ETT inserted through vocal cords under direct vision,  breath sounds checked- equal and bilateral and positive ETCO2 Secured at: 21 cm Tube secured with: Tape Dental Injury: Teeth and Oropharynx as per pre-operative assessment

## 2012-01-11 ENCOUNTER — Encounter (HOSPITAL_COMMUNITY): Payer: Self-pay | Admitting: Neurosurgery

## 2012-01-11 MED ORDER — DSS 100 MG PO CAPS: 100.0000 mg | ORAL_CAPSULE | Freq: Two times a day (BID) | ORAL | Status: AC

## 2012-01-11 MED ORDER — DIAZEPAM 5 MG PO TABS: 5.0000 mg | ORAL_TABLET | Freq: Four times a day (QID) | ORAL | Status: AC | PRN

## 2012-01-11 MED ORDER — OXYCODONE-ACETAMINOPHEN 10-325 MG PO TABS: 1.0000 | ORAL_TABLET | ORAL | Status: AC | PRN

## 2012-01-11 NOTE — Progress Notes (Signed)
Utilization review completed. Shanda Cadotte, RN, BSN. 

## 2012-01-11 NOTE — Discharge Summary (Signed)
Physician Discharge Summary  Patient ID: Kelly Torres MRN: [id number] DOB/AGE: Sep 26, 1975 35 y.o.  Admit date: 01/10/2012 Discharge date: 01/11/2012  Admission Diagnoses: Right L4-5 herniated disc, lumbar radiculopathy, lumbago, spinal stenosis  Discharge Diagnoses: The same Active Problems:  * No active hospital problems. *    Discharged Condition: good  Hospital Course: I admitted the patient to [facility] on 01/10/2012. On that day I performed a right L4-5 discectomy. The surgery were well. The patient's postoperative course was unremarkable and she was discharged home on postop day #1. The patient was given oral and written discharge instructions. All her questions were answered.  Consults: None Significant Diagnostic Studies: None Treatments: Right L4-5 discectomy using microdissection Discharge Exam: Blood pressure 115/62, pulse 58, temperature 98.4 F (36.9 C), temperature source Oral, resp. rate 16, SpO2 98.00%. The patient is alert and pleasant. Her lower extremity strength is normal. Her dressing is clean and dry.  Disposition: Home  Discharge Orders    Future Orders Please Complete By Expires   Diet - low sodium heart healthy      Increase activity slowly      Discharge instructions      Comments:   Call [phone] for followup appointment.   Remove dressing in 48 hours      Call MD for:  temperature >100.4      Call MD for:  persistant nausea and vomiting      Call MD for:  severe uncontrolled pain      Call MD for:  redness, tenderness, or signs of infection (pain, swelling, redness, odor or green/yellow discharge around incision site)      Call MD for:  difficulty breathing, headache or visual disturbances      Call MD for:  hives      Call MD for:  persistant dizziness or light-headedness      Call MD for:  extreme fatigue        Medication List  As of 01/11/2012  7:33 AM   STOP taking these medications         acetaminophen 650 MG CR tablet      HYDROcodone-acetaminophen 10-650 MG per tablet      methocarbamol 500 MG tablet         TAKE these medications         B-12 1000 MCG Caps   Take 1 tablet by mouth daily.      diazepam 5 MG tablet   Commonly known as: VALIUM   Take 1 tablet (5 mg total) by mouth every 6 (six) hours as needed.      diclofenac 0.1 % ophthalmic solution   Commonly known as: VOLTAREN   1 drop 4 (four) times daily.      diclofenac 25 MG EC tablet   Commonly known as: VOLTAREN   Take 25 mg by mouth 2 (two) times daily.      diclofenac sodium 1 % Gel   Commonly known as: VOLTAREN   Apply 1 application topically daily as needed. For pain      diclofenac sodium 1 % Gel   Commonly known as: VOLTAREN   Apply topically.      DSS 100 MG Caps   Take 100 mg by mouth 2 (two) times daily.      omeprazole 40 MG capsule   Commonly known as: PRILOSEC   Take 40 mg by mouth daily.      oxyCODONE-acetaminophen 10-325 MG per tablet   Commonly known as:  PERCOCET   Take 1 tablet by mouth every 4 (four) hours as needed for pain.      pantoprazole 40 MG tablet   Commonly known as: PROTONIX   Take 40 mg by mouth daily.             SignedCristi Loron 01/11/2012, 7:33 AM

## 2012-04-02 ENCOUNTER — Ambulatory Visit: Payer: Medicaid Other | Attending: Neurosurgery | Admitting: Physical Therapy

## 2012-04-02 DIAGNOSIS — R5381 Other malaise: Secondary | ICD-10-CM | POA: Insufficient documentation

## 2012-04-02 DIAGNOSIS — IMO0001 Reserved for inherently not codable concepts without codable children: Secondary | ICD-10-CM | POA: Insufficient documentation

## 2012-04-02 DIAGNOSIS — M545 Low back pain, unspecified: Secondary | ICD-10-CM | POA: Insufficient documentation

## 2012-04-09 ENCOUNTER — Ambulatory Visit: Payer: Medicaid Other | Admitting: Physical Therapy

## 2012-04-16 ENCOUNTER — Ambulatory Visit: Payer: Medicaid Other | Attending: Neurosurgery | Admitting: Physical Therapy

## 2012-04-16 DIAGNOSIS — IMO0001 Reserved for inherently not codable concepts without codable children: Secondary | ICD-10-CM | POA: Insufficient documentation

## 2012-04-16 DIAGNOSIS — M545 Low back pain, unspecified: Secondary | ICD-10-CM | POA: Insufficient documentation

## 2012-04-16 DIAGNOSIS — R5381 Other malaise: Secondary | ICD-10-CM | POA: Insufficient documentation

## 2012-04-23 ENCOUNTER — Encounter: Payer: Medicaid Other | Admitting: Physical Therapy

## 2012-05-26 ENCOUNTER — Other Ambulatory Visit: Payer: Self-pay | Admitting: Neurosurgery

## 2012-05-26 DIAGNOSIS — M545 Low back pain, unspecified: Secondary | ICD-10-CM

## 2012-05-27 ENCOUNTER — Inpatient Hospital Stay: Admission: RE | Admit: 2012-05-27 | Payer: Medicaid Other | Source: Ambulatory Visit

## 2012-05-30 ENCOUNTER — Ambulatory Visit
Admission: RE | Admit: 2012-05-30 | Discharge: 2012-05-30 | Disposition: A | Discharge status: Home / Self Care | Payer: Medicaid Other | Source: Ambulatory Visit | Attending: Neurosurgery | Admitting: Neurosurgery

## 2012-05-30 DIAGNOSIS — M545 Low back pain, unspecified: Secondary | ICD-10-CM

## 2012-05-30 MED ORDER — GADOBENATE DIMEGLUMINE 529 MG/ML IV SOLN
20.0000 mL | Freq: Once | INTRAVENOUS | Status: AC | PRN
  Administered 2012-05-30: 20 mL via INTRAVENOUS

## 2012-06-13 ENCOUNTER — Encounter (HOSPITAL_COMMUNITY)
Admission: RE | Admit: 2012-06-13 | Discharge: 2012-06-13 | Payer: Medicaid Other | Source: Ambulatory Visit | Attending: Neurosurgery | Admitting: Neurosurgery

## 2012-06-13 ENCOUNTER — Other Ambulatory Visit: Payer: Self-pay | Admitting: Neurosurgery

## 2012-06-13 ENCOUNTER — Encounter (HOSPITAL_COMMUNITY): Payer: Self-pay | Admitting: Pharmacy Technician

## 2012-06-13 NOTE — Pre-Procedure Instructions (Signed)
18 Kelly Torres  06/13/2012   Your procedure is scheduled on:  January 8  Report to [facility] at 10:30 AM.  Call this number if you have problems the morning of surgery: [phone]   Remember:   Do not eat or drink:After Midnight.  Take these medicines the morning of surgery with A SIP OF WATER: Omeprazole, Oxycodone     STOP Voltaren and Vitamin B12 today  Do not wear jewelry, make-up or nail polish.  Do not wear lotions, powders, or perfumes. You may wear deodorant.  Do not shave 48 hours prior to surgery. Men may shave face and neck.  Do not bring valuables to the hospital.  Contacts, dentures or bridgework may not be worn into surgery.  Leave suitcase in the car. After surgery it may be brought to your room.  For patients admitted to the hospital, checkout time is 11:00 AM the day of discharge.   Patients discharged the day of surgery will not be allowed to drive home.  Name and phone number of your driver: Family/ Friend  Special Instructions: Shower using CHG 2 nights before surgery and the night before surgery.  If you shower the day of surgery use CHG.  Use special wash - you have one bottle of CHG for all showers.  You should use approximately 1/3 of the bottle for each shower.   Please read over the following fact sheets that you were given: Pain Booklet, Coughing and Deep Breathing and Surgical Site Infection Prevention

## 2012-06-17 ENCOUNTER — Encounter (HOSPITAL_COMMUNITY): Payer: Self-pay

## 2012-06-17 MED ORDER — CEFAZOLIN SODIUM-DEXTROSE 2-3 GM-% IV SOLR
2.0000 g | INTRAVENOUS | Status: AC
  Administered 2012-06-18: 3 g via INTRAVENOUS
  Filled 2012-06-17: qty 50

## 2012-06-17 NOTE — Progress Notes (Signed)
Called patient back to tell her to stop voltaren.

## 2012-06-18 ENCOUNTER — Encounter (HOSPITAL_COMMUNITY)
Admission: RE | Disposition: A | Discharge status: Home / Self Care | Payer: Self-pay | Source: Ambulatory Visit | Attending: Neurosurgery

## 2012-06-18 ENCOUNTER — Encounter (HOSPITAL_COMMUNITY): Payer: Self-pay | Admitting: Anesthesiology

## 2012-06-18 ENCOUNTER — Inpatient Hospital Stay (HOSPITAL_COMMUNITY)
Admission: RE | Admit: 2012-06-18 | Discharge: 2012-06-22 | DRG: 490 | Disposition: A | Discharge status: Home / Self Care | Payer: Medicaid Other | Source: Ambulatory Visit | Attending: Neurosurgery | Admitting: Neurosurgery

## 2012-06-18 ENCOUNTER — Ambulatory Visit (HOSPITAL_COMMUNITY): Payer: Medicaid Other

## 2012-06-18 ENCOUNTER — Ambulatory Visit (HOSPITAL_COMMUNITY): Payer: Medicaid Other | Admitting: Anesthesiology

## 2012-06-18 ENCOUNTER — Encounter (HOSPITAL_COMMUNITY): Payer: Self-pay | Admitting: *Deleted

## 2012-06-18 DIAGNOSIS — Z6841 Body Mass Index (BMI) 40.0 and over, adult: Secondary | ICD-10-CM

## 2012-06-18 DIAGNOSIS — K219 Gastro-esophageal reflux disease without esophagitis: Secondary | ICD-10-CM | POA: Diagnosis present

## 2012-06-18 DIAGNOSIS — M5137 Other intervertebral disc degeneration, lumbosacral region: Secondary | ICD-10-CM | POA: Diagnosis present

## 2012-06-18 DIAGNOSIS — G9741 Accidental puncture or laceration of dura during a procedure: Secondary | ICD-10-CM | POA: Diagnosis not present

## 2012-06-18 DIAGNOSIS — Z79899 Other long term (current) drug therapy: Secondary | ICD-10-CM

## 2012-06-18 DIAGNOSIS — M5126 Other intervertebral disc displacement, lumbar region: Principal | ICD-10-CM | POA: Diagnosis present

## 2012-06-18 DIAGNOSIS — M51379 Other intervertebral disc degeneration, lumbosacral region without mention of lumbar back pain or lower extremity pain: Secondary | ICD-10-CM | POA: Diagnosis present

## 2012-06-18 DIAGNOSIS — IMO0002 Reserved for concepts with insufficient information to code with codable children: Secondary | ICD-10-CM | POA: Diagnosis not present

## 2012-06-18 HISTORY — PX: LUMBAR LAMINECTOMY/DECOMPRESSION MICRODISCECTOMY: SHX5026

## 2012-06-18 LAB — CBC
HCT: 37.2 % (ref 36.0–46.0)
Hemoglobin: 12.3 g/dL (ref 12.0–15.0)
MCH: 26.7 pg (ref 26.0–34.0)
MCHC: 33.1 g/dL (ref 30.0–36.0)
MCV: 80.9 fL (ref 78.0–100.0)
Platelets: 185 10*3/uL (ref 150–400)
RBC: 4.6 MIL/uL (ref 3.87–5.11)
RDW: 16 % — ABNORMAL HIGH (ref 11.5–15.5)
WBC: 4.5 10*3/uL (ref 4.0–10.5)

## 2012-06-18 LAB — HCG, SERUM, QUALITATIVE: Preg, Serum: NEGATIVE

## 2012-06-18 LAB — SURGICAL PCR SCREEN
MRSA, PCR: NEGATIVE
Staphylococcus aureus: NEGATIVE

## 2012-06-18 SURGERY — LUMBAR LAMINECTOMY/DECOMPRESSION MICRODISCECTOMY 1 LEVEL
Anesthesia: General | Laterality: Right | Wound class: Clean

## 2012-06-18 MED ORDER — MIDAZOLAM HCL 5 MG/5ML IJ SOLN
INTRAMUSCULAR | Status: DC | PRN
  Administered 2012-06-18: 2 mg via INTRAVENOUS

## 2012-06-18 MED ORDER — ONDANSETRON HCL 4 MG/2ML IJ SOLN
INTRAMUSCULAR | Status: DC | PRN
  Administered 2012-06-18: 4 mg via INTRAVENOUS

## 2012-06-18 MED ORDER — ONDANSETRON HCL 4 MG/2ML IJ SOLN: 4.0000 mg | Freq: Once | INTRAMUSCULAR | Status: DC | PRN

## 2012-06-18 MED ORDER — FENTANYL CITRATE 0.05 MG/ML IJ SOLN
INTRAMUSCULAR | Status: AC
  Administered 2012-06-18: 50 ug via INTRAVENOUS
  Filled 2012-06-18: qty 2

## 2012-06-18 MED ORDER — SODIUM CHLORIDE 0.9 % IR SOLN
Status: DC | PRN
  Administered 2012-06-18: 15:00:00

## 2012-06-18 MED ORDER — NEOSTIGMINE METHYLSULFATE 1 MG/ML IJ SOLN
INTRAMUSCULAR | Status: DC | PRN
  Administered 2012-06-18: 5 mg via INTRAVENOUS

## 2012-06-18 MED ORDER — OXYCODONE-ACETAMINOPHEN 5-325 MG PO TABS
1.0000 | ORAL_TABLET | ORAL | Status: DC | PRN
  Administered 2012-06-18 – 2012-06-22 (×10): 1 via ORAL
  Filled 2012-06-18 (×2): qty 1
  Filled 2012-06-18: qty 2
  Filled 2012-06-18 (×7): qty 1

## 2012-06-18 MED ORDER — HYDROMORPHONE HCL PF 1 MG/ML IJ SOLN
0.2500 mg | INTRAMUSCULAR | Status: DC | PRN
  Administered 2012-06-18 (×4): 0.5 mg via INTRAVENOUS

## 2012-06-18 MED ORDER — OXYCODONE-ACETAMINOPHEN 10-325 MG PO TABS: 1.0000 | ORAL_TABLET | ORAL | Status: DC | PRN

## 2012-06-18 MED ORDER — PROPOFOL 10 MG/ML IV BOLUS
INTRAVENOUS | Status: DC | PRN
  Administered 2012-06-18 (×2): 50 mg via INTRAVENOUS
  Administered 2012-06-18: 90 mg via INTRAVENOUS
  Administered 2012-06-18: 310 mg via INTRAVENOUS

## 2012-06-18 MED ORDER — LIDOCAINE HCL (CARDIAC) 20 MG/ML IV SOLN
INTRAVENOUS | Status: DC | PRN
  Administered 2012-06-18: 70 mg via INTRAVENOUS

## 2012-06-18 MED ORDER — HYDROMORPHONE HCL PF 1 MG/ML IJ SOLN
INTRAMUSCULAR | Status: AC
  Administered 2012-06-18: 0.5 mg via INTRAVENOUS
  Filled 2012-06-18: qty 1

## 2012-06-18 MED ORDER — FENTANYL CITRATE 0.05 MG/ML IJ SOLN
INTRAMUSCULAR | Status: DC | PRN
  Administered 2012-06-18 (×3): 50 ug via INTRAVENOUS
  Administered 2012-06-18: 100 ug via INTRAVENOUS

## 2012-06-18 MED ORDER — SODIUM CHLORIDE 0.9 % IJ SOLN: 3.0000 mL | INTRAMUSCULAR | Status: DC | PRN

## 2012-06-18 MED ORDER — DIAZEPAM 5 MG PO TABS
5.0000 mg | ORAL_TABLET | Freq: Four times a day (QID) | ORAL | Status: DC | PRN
  Administered 2012-06-18 – 2012-06-21 (×9): 5 mg via ORAL
  Filled 2012-06-18 (×10): qty 1

## 2012-06-18 MED ORDER — FENTANYL CITRATE 0.05 MG/ML IJ SOLN
50.0000 ug | Freq: Once | INTRAMUSCULAR | Status: AC
  Administered 2012-06-18 (×2): 50 ug via INTRAVENOUS

## 2012-06-18 MED ORDER — SODIUM CHLORIDE 0.9 % IV SOLN
INTRAVENOUS | Status: AC
  Filled 2012-06-18: qty 500

## 2012-06-18 MED ORDER — BACITRACIN 50000 UNITS IM SOLR
INTRAMUSCULAR | Status: AC
  Filled 2012-06-18: qty 1

## 2012-06-18 MED ORDER — THROMBIN 5000 UNITS EX SOLR
CUTANEOUS | Status: DC | PRN
  Administered 2012-06-18 (×2): 5000 [IU] via TOPICAL

## 2012-06-18 MED ORDER — ZOLPIDEM TARTRATE 5 MG PO TABS
5.0000 mg | ORAL_TABLET | Freq: Every evening | ORAL | Status: DC | PRN
  Administered 2012-06-21: 5 mg via ORAL
  Filled 2012-06-18: qty 1

## 2012-06-18 MED ORDER — MUPIROCIN 2 % EX OINT
TOPICAL_OINTMENT | Freq: Once | CUTANEOUS | Status: DC
  Filled 2012-06-18: qty 22

## 2012-06-18 MED ORDER — LIDOCAINE HCL 4 % MT SOLN
OROMUCOSAL | Status: DC | PRN
  Administered 2012-06-18: 4 mL via TOPICAL

## 2012-06-18 MED ORDER — DOCUSATE SODIUM 100 MG PO CAPS
100.0000 mg | ORAL_CAPSULE | Freq: Two times a day (BID) | ORAL | Status: DC
  Administered 2012-06-18 – 2012-06-22 (×8): 100 mg via ORAL
  Filled 2012-06-18 (×7): qty 1

## 2012-06-18 MED ORDER — SODIUM CHLORIDE 0.9 % IV SOLN: 250.0000 mL | INTRAVENOUS | Status: DC

## 2012-06-18 MED ORDER — ONDANSETRON HCL 4 MG/2ML IJ SOLN
4.0000 mg | INTRAMUSCULAR | Status: DC | PRN
  Administered 2012-06-19: 4 mg via INTRAVENOUS
  Filled 2012-06-18: qty 2

## 2012-06-18 MED ORDER — CEFAZOLIN SODIUM-DEXTROSE 2-3 GM-% IV SOLR
2.0000 g | Freq: Three times a day (TID) | INTRAVENOUS | Status: AC
  Administered 2012-06-18 – 2012-06-19 (×2): 2 g via INTRAVENOUS
  Filled 2012-06-18 (×2): qty 50

## 2012-06-18 MED ORDER — BACITRACIN ZINC 500 UNIT/GM EX OINT
TOPICAL_OINTMENT | CUTANEOUS | Status: DC | PRN
  Administered 2012-06-18: 1 via TOPICAL

## 2012-06-18 MED ORDER — ACETAMINOPHEN 650 MG RE SUPP: 650.0000 mg | RECTAL | Status: DC | PRN

## 2012-06-18 MED ORDER — GLYCOPYRROLATE 0.2 MG/ML IJ SOLN
INTRAMUSCULAR | Status: DC | PRN
  Administered 2012-06-18: .6 mg via INTRAVENOUS

## 2012-06-18 MED ORDER — PHENYLEPHRINE HCL 10 MG/ML IJ SOLN
INTRAMUSCULAR | Status: DC | PRN
  Administered 2012-06-18 (×2): 80 ug via INTRAVENOUS

## 2012-06-18 MED ORDER — MUPIROCIN 2 % EX OINT
TOPICAL_OINTMENT | CUTANEOUS | Status: AC
  Administered 2012-06-18: 1 via NASAL
  Filled 2012-06-18: qty 22

## 2012-06-18 MED ORDER — LACTATED RINGERS IV SOLN
INTRAVENOUS | Status: DC
  Administered 2012-06-18: 20:00:00 via INTRAVENOUS
  Administered 2012-06-20: 1000 mL via INTRAVENOUS

## 2012-06-18 MED ORDER — OXYCODONE HCL 5 MG PO TABS
5.0000 mg | ORAL_TABLET | ORAL | Status: DC | PRN
  Administered 2012-06-18 – 2012-06-22 (×6): 5 mg via ORAL
  Filled 2012-06-18 (×6): qty 1

## 2012-06-18 MED ORDER — DICLOFENAC SODIUM 25 MG PO TBEC
25.0000 mg | DELAYED_RELEASE_TABLET | Freq: Two times a day (BID) | ORAL | Status: DC
  Administered 2012-06-18 – 2012-06-22 (×8): 25 mg via ORAL
  Filled 2012-06-18 (×13): qty 1

## 2012-06-18 MED ORDER — PANTOPRAZOLE SODIUM 40 MG PO TBEC
80.0000 mg | DELAYED_RELEASE_TABLET | Freq: Every day | ORAL | Status: DC
  Administered 2012-06-19 – 2012-06-22 (×4): 80 mg via ORAL
  Filled 2012-06-18 (×3): qty 2

## 2012-06-18 MED ORDER — MENTHOL 3 MG MT LOZG: 1.0000 | LOZENGE | OROMUCOSAL | Status: DC | PRN

## 2012-06-18 MED ORDER — MORPHINE SULFATE 2 MG/ML IJ SOLN
1.0000 mg | INTRAMUSCULAR | Status: DC | PRN
  Administered 2012-06-18: 4 mg via INTRAVENOUS
  Administered 2012-06-18: 2 mg via INTRAVENOUS
  Administered 2012-06-19 (×2): 4 mg via INTRAVENOUS
  Administered 2012-06-19: 2 mg via INTRAVENOUS
  Filled 2012-06-18 (×2): qty 2
  Filled 2012-06-18: qty 1
  Filled 2012-06-18 (×2): qty 2

## 2012-06-18 MED ORDER — BUPIVACAINE-EPINEPHRINE PF 0.5-1:200000 % IJ SOLN
INTRAMUSCULAR | Status: DC | PRN
  Administered 2012-06-18: 10 mL

## 2012-06-18 MED ORDER — ACETAMINOPHEN 325 MG PO TABS
650.0000 mg | ORAL_TABLET | ORAL | Status: DC | PRN
  Administered 2012-06-19: 650 mg via ORAL
  Filled 2012-06-18: qty 2

## 2012-06-18 MED ORDER — HYDROCODONE-ACETAMINOPHEN 5-325 MG PO TABS
1.0000 | ORAL_TABLET | ORAL | Status: DC | PRN
  Administered 2012-06-19 – 2012-06-20 (×7): 2 via ORAL
  Filled 2012-06-18 (×7): qty 2

## 2012-06-18 MED ORDER — ROCURONIUM BROMIDE 100 MG/10ML IV SOLN
INTRAVENOUS | Status: DC | PRN
  Administered 2012-06-18: 5 mg via INTRAVENOUS
  Administered 2012-06-18: 50 mg via INTRAVENOUS
  Administered 2012-06-18: 10 mg via INTRAVENOUS
  Administered 2012-06-18 (×2): 5 mg via INTRAVENOUS
  Administered 2012-06-18: 10 mg via INTRAVENOUS

## 2012-06-18 MED ORDER — LACTATED RINGERS IV SOLN
INTRAVENOUS | Status: DC | PRN
  Administered 2012-06-18 (×2): via INTRAVENOUS

## 2012-06-18 MED ORDER — PHENOL 1.4 % MT LIQD: 1.0000 | OROMUCOSAL | Status: DC | PRN

## 2012-06-18 MED ORDER — SODIUM CHLORIDE 0.9 % IJ SOLN
3.0000 mL | Freq: Two times a day (BID) | INTRAMUSCULAR | Status: DC
  Administered 2012-06-19 – 2012-06-22 (×5): 3 mL via INTRAVENOUS

## 2012-06-18 MED ORDER — HEMOSTATIC AGENTS (NO CHARGE) OPTIME
TOPICAL | Status: DC | PRN
  Administered 2012-06-18: 1 via TOPICAL

## 2012-06-18 SURGICAL SUPPLY — 57 items
APL SKNCLS STERI-STRIP NONHPOA (GAUZE/BANDAGES/DRESSINGS) ×1
BAG DECANTER FOR FLEXI CONT (MISCELLANEOUS) ×2 IMPLANT
BENZOIN TINCTURE PRP APPL 2/3 (GAUZE/BANDAGES/DRESSINGS) ×2 IMPLANT
BLADE SURG ROTATE 9660 (MISCELLANEOUS) IMPLANT
BRUSH SCRUB EZ PLAIN DRY (MISCELLANEOUS) ×2 IMPLANT
BUR ACORN 6.0 (BURR) ×2 IMPLANT
BUR MATCHSTICK NEURO 3.0 LAGG (BURR) ×2 IMPLANT
CANISTER SUCTION 2500CC (MISCELLANEOUS) ×2 IMPLANT
CLOTH BEACON ORANGE TIMEOUT ST (SAFETY) ×2 IMPLANT
CONT SPEC 4OZ CLIKSEAL STRL BL (MISCELLANEOUS) ×2 IMPLANT
DRAPE LAPAROTOMY 100X72X124 (DRAPES) ×2 IMPLANT
DRAPE MICROSCOPE LEICA (MISCELLANEOUS) ×2 IMPLANT
DRAPE POUCH INSTRU U-SHP 10X18 (DRAPES) ×2 IMPLANT
DRAPE SURG 17X23 STRL (DRAPES) ×8 IMPLANT
DURASEAL SPINE SEALANT 3ML (MISCELLANEOUS) ×1 IMPLANT
ELECT BLADE 4.0 EZ CLEAN MEGAD (MISCELLANEOUS) ×2
ELECT BLADE 6.5 EXT (BLADE) ×2 IMPLANT
ELECT REM PT RETURN 9FT ADLT (ELECTROSURGICAL) ×2
ELECTRODE BLDE 4.0 EZ CLN MEGD (MISCELLANEOUS) ×1 IMPLANT
ELECTRODE REM PT RTRN 9FT ADLT (ELECTROSURGICAL) ×1 IMPLANT
GAUZE SPONGE 4X4 16PLY XRAY LF (GAUZE/BANDAGES/DRESSINGS) ×1 IMPLANT
GLOVE BIO SURGEON STRL SZ8.5 (GLOVE) ×2 IMPLANT
GLOVE BIOGEL PI IND STRL 7.0 (GLOVE) IMPLANT
GLOVE BIOGEL PI INDICATOR 7.0 (GLOVE) ×1
GLOVE EXAM NITRILE LRG STRL (GLOVE) IMPLANT
GLOVE EXAM NITRILE MD LF STRL (GLOVE) ×1 IMPLANT
GLOVE EXAM NITRILE XL STR (GLOVE) IMPLANT
GLOVE EXAM NITRILE XS STR PU (GLOVE) ×1 IMPLANT
GLOVE SS BIOGEL STRL SZ 6.5 (GLOVE) IMPLANT
GLOVE SS BIOGEL STRL SZ 8 (GLOVE) ×1 IMPLANT
GLOVE SUPERSENSE BIOGEL SZ 6.5 (GLOVE) ×2
GLOVE SUPERSENSE BIOGEL SZ 8 (GLOVE) ×1
GOWN BRE IMP SLV AUR LG STRL (GOWN DISPOSABLE) ×1 IMPLANT
GOWN BRE IMP SLV AUR XL STRL (GOWN DISPOSABLE) ×3 IMPLANT
GOWN STRL REIN 2XL LVL4 (GOWN DISPOSABLE) IMPLANT
KIT BASIN OR (CUSTOM PROCEDURE TRAY) ×2 IMPLANT
KIT ROOM TURNOVER OR (KITS) ×2 IMPLANT
NEEDLE HYPO 21X1.5 SAFETY (NEEDLE) IMPLANT
NEEDLE HYPO 22GX1.5 SAFETY (NEEDLE) ×2 IMPLANT
NS IRRIG 1000ML POUR BTL (IV SOLUTION) ×2 IMPLANT
PACK LAMINECTOMY NEURO (CUSTOM PROCEDURE TRAY) ×2 IMPLANT
PAD ARMBOARD 7.5X6 YLW CONV (MISCELLANEOUS) ×6 IMPLANT
PATTIES SURGICAL .5 X1 (DISPOSABLE) IMPLANT
RUBBERBAND STERILE (MISCELLANEOUS) ×4 IMPLANT
SPONGE GAUZE 4X4 12PLY (GAUZE/BANDAGES/DRESSINGS) ×2 IMPLANT
SPONGE SURGIFOAM ABS GEL SZ50 (HEMOSTASIS) ×2 IMPLANT
STRIP CLOSURE SKIN 1/2X4 (GAUZE/BANDAGES/DRESSINGS) ×2 IMPLANT
SUT PROLENE 6 0 BV (SUTURE) ×1 IMPLANT
SUT VIC AB 1 CT1 18XBRD ANBCTR (SUTURE) ×1 IMPLANT
SUT VIC AB 1 CT1 8-18 (SUTURE) ×2
SUT VIC AB 2-0 CP2 18 (SUTURE) ×3 IMPLANT
SYR 20CC LL (SYRINGE) IMPLANT
SYR 20ML ECCENTRIC (SYRINGE) ×2 IMPLANT
TAPE CLOTH SURG 4X10 WHT LF (GAUZE/BANDAGES/DRESSINGS) ×1 IMPLANT
TOWEL OR 17X24 6PK STRL BLUE (TOWEL DISPOSABLE) ×2 IMPLANT
TOWEL OR 17X26 10 PK STRL BLUE (TOWEL DISPOSABLE) ×2 IMPLANT
WATER STERILE IRR 1000ML POUR (IV SOLUTION) ×2 IMPLANT

## 2012-06-18 NOTE — Transfer of Care (Signed)
Immediate Anesthesia Transfer of Care Note  Patient: Kelly Torres  Procedure(s) Performed: Procedure(s) (LRB) with comments: LUMBAR LAMINECTOMY/DECOMPRESSION MICRODISCECTOMY 1 LEVEL (Right) - Redo Right Lumbar Four-Five Diskectomy  Patient Location: PACU  Anesthesia Type:General  Level of Consciousness: awake, sedated and patient cooperative  Airway & Oxygen Therapy: Patient Spontanous Breathing and Patient connected to nasal cannula oxygen  Post-op Assessment: Report given to PACU RN and Post -op Vital signs reviewed and stable  Post vital signs: Reviewed and stable  Complications: No apparent anesthesia complications

## 2012-06-18 NOTE — H&P (Signed)
Subjective: The patient is a 37 year old black female who I performed a previous right L4-5 discectomy on. She has developed recurrent back buttocks and leg pain consistent with a lumbar radiculopathy. She has failed medical management and was worked up with a lumbar MRI. This demonstrated a recurrent ruptured disc at L4-5 on the right. I discussed the various treatment options with the patient including surgery. She has weighed the risks, benefits, and alternatives surgery and decided proceed with a right L4-5 redo discectomy.   Past Medical History  Diagnosis Date  . Hx of migraines     last 08/2011  . Lumbar herniated disc     pain radiates down right leg  . GERD (gastroesophageal reflux disease)   . Constipation - functional   . Arthritis     back  . Menometrorrhagia   . Spinal headache   . Hernia, abdominal   . Anemia     Past Surgical History  Procedure Date  . Tonsillectomy age 30  . Anterior cruciate ligament repair 2001    right knee, Dr. Romeo Apple, [facility]  . Cesarean section 2002, 2005    [place]  . Endometrial ablation   . Lumbar laminectomy/decompression microdiscectomy 01/10/2012    Procedure: LUMBAR LAMINECTOMY/DECOMPRESSION MICRODISCECTOMY 1 LEVEL;  Surgeon: Cristi Loron, MD;  Location: [facility];  Service: Neurosurgery;  Laterality: Right;  RIGHT Lumbar Four-Five Diskectomy    Allergies  Allergen Reactions  . Latex Rash    Allergic only to condoms, no problems with elastic in clothes, gloves etc    History  Substance Use Topics  . Smoking status: Never Smoker   . Smokeless tobacco: Not on file  . Alcohol Use: No    History reviewed. No pertinent family history. Prior to Admission medications   Medication Sig Start Date End Date Taking? Authorizing Provider  Cyanocobalamin (B-12) 1000 MCG CAPS Take 1 tablet by mouth daily.     Yes Historical Provider, MD  diclofenac (VOLTAREN) 25 MG EC tablet Take 25 mg by mouth 2 (two) times daily.   Yes Historical  Provider, MD  diclofenac sodium (VOLTAREN) 1 % GEL Apply 2 g topically 2 (two) times daily.   Yes Historical Provider, MD  omeprazole (PRILOSEC) 40 MG capsule Take 40 mg by mouth daily.     Yes Historical Provider, MD  oxyCODONE-acetaminophen (PERCOCET) 10-325 MG per tablet Take 1 tablet by mouth every 4 (four) hours as needed. For pain   Yes Historical Provider, MD     Review of Systems  Positive ROS: As above  All other systems have been reviewed and were otherwise negative with the exception of those mentioned in the HPI and as above.  Objective: Vital signs in last 24 hours: Temp:  [97.9 F (36.6 C)] 97.9 F (36.6 C) (01/08 1053) Pulse Rate:  [73] 73  (01/08 1053) Resp:  [20] 20  (01/08 1053) BP: (136)/(85) 136/85 mmHg (01/08 1053) SpO2:  [100 %] 100 % (01/08 1053) Weight:  [145.7 kg (321 lb 3.4 oz)] 145.7 kg (321 lb 3.4 oz) (01/08 1053)  General Appearance: Alert, cooperative, no distress, appears stated age Head: Normocephalic, without obvious abnormality, atraumatic Eyes: PERRL, conjunctiva/corneas clear, EOM's intact, fundi benign, both eyes      Ears: Normal TM's and external ear canals, both ears Throat: Lips, mucosa, and tongue normal; teeth and gums normal Neck: Supple, symmetrical, trachea midline, no adenopathy; thyroid: No enlargement/tenderness/nodules; no carotid bruit or JVD Back: Symmetric, no curvature, ROM normal, no CVA tenderness. The patient's  lumbar incision is well-healed. Lungs: Clear to auscultation bilaterally, respirations unlabored Heart: Regular rate and rhythm, S1 and S2 normal, no murmur, rub or gallop Abdomen: Soft, non-tender, bowel sounds active all four quadrants, no masses, no organomegaly Extremities: Extremities normal, atraumatic, no cyanosis or edema Pulses: 2+ and symmetric all extremities Skin: Skin color, texture, turgor normal, no rashes or lesions  NEUROLOGIC:   Mental status: alert and oriented, no aphasia, good attention span,  Fund of knowledge/ memory ok Motor Exam - grossly normal Sensory Exam - grossly normal Reflexes: Symmetric Coordination - grossly normal Gait - grossly normal Balance - grossly normal Cranial Nerves: I: smell Not tested  II: visual acuity  OS: Normal    OD: Normal   II: visual fields Full to confrontation  II: pupils Equal, round, reactive to light  III,VII: ptosis None  III,IV,VI: extraocular muscles  Full ROM  V: mastication Normal  V: facial light touch sensation  Normal  V,VII: corneal reflex  Present  VII: facial muscle function - upper  Normal  VII: facial muscle function - lower Normal  VIII: hearing Not tested  IX: soft palate elevation  Normal  IX,X: gag reflex Present  XI: trapezius strength  5/5  XI: sternocleidomastoid strength 5/5  XI: neck flexion strength  5/5  XII: tongue strength  Normal    Data Review Lab Results  Component Value Date   WBC 4.5 06/18/2012   HGB 12.3 06/18/2012   HCT 37.2 06/18/2012   MCV 80.9 06/18/2012   PLT 185 06/18/2012   Lab Results  Component Value Date   NA 138 10/30/2011   K 3.7 10/30/2011   CL 106 10/30/2011   CO2 24 10/30/2011   BUN 13 10/30/2011   CREATININE 0.76 10/30/2011   GLUCOSE 75 10/30/2011   No results found for this basename: INR, PROTIME    Assessment/Plan: 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 I have reviewed her MR scan with her and pointed out the abnormalities. We have discussed the various treatment options including a redo right L4-5 discectomy. I described the surgery to her. I have shown her surgical models. We have discussed the risks, benefits, and alternatives surgery as well as likelihood of achieving our goals with surgery. I have answered all the patient's questions. She has decided proceed with surgery.   Lenny Bouchillon D 06/18/2012 1:05 PM

## 2012-06-18 NOTE — Anesthesia Preprocedure Evaluation (Signed)
Anesthesia Evaluation  Patient identified by MRN, date of birth, ID band Patient awake    Reviewed: Allergy & Precautions, H&P , NPO status , Patient's Chart, lab work & pertinent test results  History of Anesthesia Complications (+) POST - OP SPINAL HEADACHE  Airway Mallampati: I TM Distance: >3 FB Neck ROM: full    Dental   Pulmonary          Cardiovascular Rhythm:regular Rate:Normal     Neuro/Psych  Headaches,    GI/Hepatic GERD-  ,  Endo/Other  Morbid obesity  Renal/GU      Musculoskeletal   Abdominal   Peds  Hematology   Anesthesia Other Findings   Reproductive/Obstetrics                           Anesthesia Physical Anesthesia Plan  ASA: I  Anesthesia Plan: General   Post-op Pain Management:    Induction: Intravenous  Airway Management Planned: Oral ETT  Additional Equipment:   Intra-op Plan:   Post-operative Plan: Extubation in OR  Informed Consent: I have reviewed the patients History and Physical, chart, labs and discussed the procedure including the risks, benefits and alternatives for the proposed anesthesia with the patient or authorized representative who has indicated his/her understanding and acceptance.     Plan Discussed with: Surgeon, Anesthesiologist and CRNA  Anesthesia Plan Comments:         Anesthesia Quick Evaluation

## 2012-06-18 NOTE — Preoperative (Signed)
Beta Blockers   Reason not to administer Beta Blockers:Not Applicable 

## 2012-06-18 NOTE — Progress Notes (Signed)
Patient arrived from PACU. PACU RN stated there was some difficulty placing catheter for patient who is ordered to be on flat bedrest. She had ordered non-latex catheters for patient and she would bring them up when they arrived. Catheter placed, clear yellow urine drained, catheter bag placed below bladder. Will continue to monitor.

## 2012-06-18 NOTE — Progress Notes (Signed)
Subjective:  The patient is somnolent but easily arousable. She is in no apparent distress.  Objective: Vital signs in last 24 hours: Temp:  [97.9 F (36.6 C)] 97.9 F (36.6 C) (01/08 1053) Pulse Rate:  [73] 73  (01/08 1053) Resp:  [20] 20  (01/08 1053) BP: (136)/(85) 136/85 mmHg (01/08 1053) SpO2:  [100 %] 100 % (01/08 1053) Weight:  [145.7 kg (321 lb 3.4 oz)] 145.7 kg (321 lb 3.4 oz) (01/08 1053)  Intake/Output from previous day:   Intake/Output this shift: Total I/O In: 1800 [I.V.:1800] Out: 25 [Blood:25]  Physical exam the patient has normal bilateral dorsiflexor and gastrocnemius strength.  Lab Results:  Basename 06/18/12 1216  WBC 4.5  HGB 12.3  HCT 37.2  PLT 185   BMET No results found for this basename: NA:2,K:2,CL:2,CO2:2,GLUCOSE:2,BUN:2,CREATININE:2,CALCIUM:2 in the last 72 hours  Studies/Results: Dg Lumbar Spine 2-3 Views  06/18/2012  *RADIOLOGY REPORT*  Clinical Data: L4-L5 laminectomy. L4-L5 microdiskectomy.  LUMBAR SPINE - 2-3 VIEW  Comparison: 05/30/2012.  Findings: Intraoperative lateral radiographs demonstrate intraoperative localization initially at L3-L4. On subsequent film, soft tissue retractors are present at L4-L5 with the probe overlying the L4 spinous process.  IMPRESSION: Intraoperative localization L4-L5.   Original Report Authenticated By: Andreas Newport, M.D.     Assessment/Plan: The patient is doing well.  LOS: 0 days     Atiyah Bauer D 06/18/2012, 4:29 PM

## 2012-06-18 NOTE — Anesthesia Postprocedure Evaluation (Signed)
  Anesthesia Post-op Note  Patient: Kelly Torres  Procedure(s) Performed: Procedure(s) (LRB) with comments: LUMBAR LAMINECTOMY/DECOMPRESSION MICRODISCECTOMY 1 LEVEL (Right) - Redo Right Lumbar Four-Five Diskectomy  Patient Location: PACU  Anesthesia Type:General  Level of Consciousness: awake and alert   Airway and Oxygen Therapy: Patient Spontanous Breathing and Patient connected to nasal cannula oxygen  Post-op Pain: mild  Post-op Assessment: Post-op Vital signs reviewed, Patient's Cardiovascular Status Stable, Respiratory Function Stable, Patent Airway and No signs of Nausea or vomiting  Post-op Vital Signs: Reviewed and stable  Complications: No apparent anesthesia complications

## 2012-06-18 NOTE — Op Note (Signed)
Brief history: The patient is a 37 year old black female who I performed a right L4-5 discectomy on several months ago. The patient has developed recurrent back buttocks and leg pain consistent with a lumbar radiculopathy. She has failed medical management and was worked up with a lumbar MRI. This demonstrated findings consistent with a recurrent ruptured disc at L4-5. I discussed the various treatment options with the patient including surgery. She has weighed the risks, benefits, and alternatives surgery and decided proceed with a redo right L4-5 discectomy.  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 Delma Officer  Asst.: Dr. Marikay Alar  Anesthesia: Gen. endotracheal  Estimated blood loss: 75 cc  Drains: None  Complications: None  Description of procedure: The patient was brought to the operating room by the anesthesia team. General endotracheal anesthesia was induced. The patient was turned to the prone position on the Wilson frame. The patient's lumbosacral region was then prepared with Betadine scrub and Betadine solution. Sterile drapes were applied.  I then injected the area to be incised with Marcaine with epinephrine solution. I then used a scalpel to make a linear midline incision over the L4-5 intervertebral disc space incising through the previous surgical scar. I then used electrocautery to perform a right sided subperiosteal dissection exposing the spinous process and lamina of L3-L5. We obtained intraoperative radiograph to confirm our location. I then inserted the [facility] retractor for exposure.  We then brought the operative microscope into the field. Under its magnification and illumination we completed the microdissection. I used a high-speed drill to perform a redo laminotomy at L4 on the  right. I then used a Kerrison punches to widen the laminotomy and removed the epidural scar tissue at L4-5. We then used microdissection to free up the thecal sac and the right L5 nerve root from the epidural tissue. I then used a Kerrison punch to perform a foraminotomy at about the right L5 nerve root. We did note a small durotomy which we closed with a single 6-0 Prolene suture. We then using the nerve root retractor to gently retract the thecal sac and the right L5 nerve root medially. This exposed the recurrent intervertebral disc herniation. We identified the ruptured disc and remove it with the pituitary forceps.  I then palpated along the ventral surface of the thecal sac and along exit route of the right L5 nerve root and noted that the neural structures were well decompressed. This completed the decompression. I then placed a DuraSeal over the durotomy.  We then obtained hemostasis using bipolar electrocautery. We irrigated the wound out with bacitracin solution. We then removed the retractor. We then reapproximated the patient's thoracolumbar fascia with interrupted #1 Vicryl suture. We then reapproximated the patient's subcutaneous tissue with interrupted 3-0 Vicryl suture. We then reapproximated patient's skin with Steri-Strips and benzoin. The was then coated with bacitracin ointment. The drapes were removed. The patient was subsequently returned to the supine position where they were extubated by the anesthesia team. The patient was then transported to the postanesthesia care unit in stable condition. All sponge instrument and needle counts were reportedly correct at the end of this case.

## 2012-06-19 ENCOUNTER — Inpatient Hospital Stay (HOSPITAL_COMMUNITY): Payer: Medicaid Other

## 2012-06-19 LAB — CBC WITH DIFFERENTIAL/PLATELET
Basophils Absolute: 0 10*3/uL (ref 0.0–0.1)
Basophils Relative: 0 % (ref 0–1)
Eosinophils Absolute: 0 10*3/uL (ref 0.0–0.7)
Eosinophils Relative: 1 % (ref 0–5)
HCT: 35.7 % — ABNORMAL LOW (ref 36.0–46.0)
Hemoglobin: 11.4 g/dL — ABNORMAL LOW (ref 12.0–15.0)
Lymphocytes Relative: 19 % (ref 12–46)
Lymphs Abs: 1.1 10*3/uL (ref 0.7–4.0)
MCH: 26 pg (ref 26.0–34.0)
MCHC: 31.9 g/dL (ref 30.0–36.0)
MCV: 81.5 fL (ref 78.0–100.0)
Monocytes Absolute: 0.3 10*3/uL (ref 0.1–1.0)
Monocytes Relative: 6 % (ref 3–12)
Neutro Abs: 4.4 10*3/uL (ref 1.7–7.7)
Neutrophils Relative %: 74 % (ref 43–77)
Platelets: 155 10*3/uL (ref 150–400)
RBC: 4.38 MIL/uL (ref 3.87–5.11)
RDW: 16.1 % — ABNORMAL HIGH (ref 11.5–15.5)
WBC: 5.9 10*3/uL (ref 4.0–10.5)

## 2012-06-19 MED ORDER — PNEUMOCOCCAL VAC POLYVALENT 25 MCG/0.5ML IJ INJ
0.5000 mL | INJECTION | INTRAMUSCULAR | Status: AC
  Administered 2012-06-20: 0.5 mL via INTRAMUSCULAR
  Filled 2012-06-19: qty 0.5

## 2012-06-19 MED ORDER — INFLUENZA VIRUS VACC SPLIT PF IM SUSP
0.5000 mL | INTRAMUSCULAR | Status: AC
  Administered 2012-06-20: 0.5 mL via INTRAMUSCULAR
  Filled 2012-06-19: qty 0.5

## 2012-06-19 NOTE — Progress Notes (Signed)
Patient ID: Wonda Amis, female   DOB: 1975/12/12, 37 y.o.   MRN: [id number] Subjective:  The patient is alert and pleasant. Her back is sore.  Objective: Vital signs in last 24 hours: Temp:  [97.6 F (36.4 C)-98.6 F (37 C)] 98.4 F (36.9 C) (01/09 0536) Pulse Rate:  [51-87] 76  (01/09 0536) Resp:  [12-20] 16  (01/09 0536) BP: (100-136)/(47-85) 110/60 mmHg (01/09 0536) SpO2:  [96 %-100 %] 96 % (01/09 0536) Weight:  [145.7 kg (321 lb 3.4 oz)] 145.7 kg (321 lb 3.4 oz) (01/08 1053)  Intake/Output from previous day: 01/08 0701 - 01/09 0700 In: 1800 [I.V.:1800] Out: 1625 [Urine:1600; Blood:25] Intake/Output this shift:    Physical exam the patient is moving her lower extremities well. Her dressing is clean and dry.  Lab Results:  Basename 06/18/12 1216  WBC 4.5  HGB 12.3  HCT 37.2  PLT 185   BMET No results found for this basename: NA:2,K:2,CL:2,CO2:2,GLUCOSE:2,BUN:2,CREATININE:2,CALCIUM:2 in the last 72 hours  Studies/Results: Dg Lumbar Spine 2-3 Views  06/18/2012  *RADIOLOGY REPORT*  Clinical Data: L4-L5 laminectomy. L4-L5 microdiskectomy.  LUMBAR SPINE - 2-3 VIEW  Comparison: 05/30/2012.  Findings: Intraoperative lateral radiographs demonstrate intraoperative localization initially at L3-L4. On subsequent film, soft tissue retractors are present at L4-L5 with the probe overlying the L4 spinous process.  IMPRESSION: Intraoperative localization L4-L5.   Original Report Authenticated By: Andreas Newport, M.D.     Assessment/Plan: Postop day #1: I will keep the patient at bedrest today and mobilize her tomorrow with PT and OT.  LOS: 1 day     Cyana Shook D 06/19/2012, 9:28 AM

## 2012-06-20 ENCOUNTER — Encounter (HOSPITAL_COMMUNITY): Payer: Self-pay | Admitting: Neurosurgery

## 2012-06-20 LAB — URINALYSIS, ROUTINE W REFLEX MICROSCOPIC
Bilirubin Urine: NEGATIVE
Glucose, UA: NEGATIVE mg/dL
Ketones, ur: 15 mg/dL — AB
Leukocytes, UA: NEGATIVE
Nitrite: NEGATIVE
Protein, ur: NEGATIVE mg/dL
Specific Gravity, Urine: 1.018 (ref 1.005–1.030)
Urobilinogen, UA: 1 mg/dL (ref 0.0–1.0)
pH: 6.5 (ref 5.0–8.0)

## 2012-06-20 LAB — URINE MICROSCOPIC-ADD ON

## 2012-06-20 MED ORDER — OXYCODONE-ACETAMINOPHEN 10-325 MG PO TABS: 1.0000 | ORAL_TABLET | ORAL | Status: DC | PRN

## 2012-06-20 MED ORDER — DIAZEPAM 5 MG PO TABS: 5.0000 mg | ORAL_TABLET | Freq: Four times a day (QID) | ORAL | Status: DC | PRN

## 2012-06-20 NOTE — Discharge Summary (Signed)
  Physician Discharge Summary  Patient ID: Kelly Torres MRN: [id number] DOB/AGE: 10-27-1975 37 y.o.  Admit date: 06/18/2012 Discharge date: 06/20/2012  Admission Diagnoses: Recurrent right L4-5 herniated disc, lumbar radiculopathy, lumbago, lumbar degenerative disc disease  Discharge Diagnoses: The same Active Problems:  * No active hospital problems. *    Discharged Condition: good  Hospital Course: I admitted the patient to [facility]  on 06/18/2012. On that day I performed a redo right L4-5 discectomy. Because of the intraoperative durotomy the patient was kept at bed rest until 06/20/12. On that day she was mobilized. The plan is to discharge her to home later today. The patient was given oral and written discharge instructions. All her questions were answered.  The patient did have a low-grade fever. We check a chest x-ray, urinalysis, urine cultures blood cultures etc.. these turned out okay.  Consults: None Significant Diagnostic Studies: None Treatments: redo right L4-5 discectomy using microdissection Discharge Exam: Blood pressure 93/46, pulse 64, temperature 97.9 F (36.6 C), temperature source Oral, resp. rate 20, height 6\' 1"  (1.854 m), weight 145.7 kg (321 lb 3.4 oz), last menstrual period 06/11/2012, SpO2 99.00%. The patient is alert and pleasant. Her strength is normal. Her dressing is clean and dry.  Disposition: Home     Medication List     As of 06/20/2012  7:09 AM    TAKE these medications         B-12 1000 MCG Caps   Take 1 tablet by mouth daily.      diazepam 5 MG tablet   Commonly known as: VALIUM   Take 1 tablet (5 mg total) by mouth every 6 (six) hours as needed.      diclofenac 25 MG EC tablet   Commonly known as: VOLTAREN   Take 25 mg by mouth 2 (two) times daily.      diclofenac sodium 1 % Gel   Commonly known as: VOLTAREN   Apply 2 g topically 2 (two) times daily.      omeprazole 40 MG capsule   Commonly known as: PRILOSEC   Take  40 mg by mouth daily.      oxyCODONE-acetaminophen 10-325 MG per tablet   Commonly known as: PERCOCET   Take 1 tablet by mouth every 4 (four) hours as needed for pain.      oxyCODONE-acetaminophen 10-325 MG per tablet   Commonly known as: PERCOCET   Take 1 tablet by mouth every 4 (four) hours as needed. For pain         Signed: Cristi Loron 06/20/2012, 7:09 AM

## 2012-06-21 LAB — URINE CULTURE
Colony Count: NO GROWTH
Culture: NO GROWTH

## 2012-06-21 NOTE — Progress Notes (Signed)
Patient ID: Kelly Torres, female   DOB: [birth year], 37 y.o.   MRN: [id number] Wound dry, no headache.to start ambulation

## 2012-06-22 NOTE — Progress Notes (Signed)
Patient ID: Kelly Torres, female   DOB: July 28, 1975, 37 y.o.   MRN: [id number] Ambulating. Ready to go home

## 2012-06-22 NOTE — Evaluation (Signed)
Physical Therapy Evaluation Patient Details Name: Kelly Torres MRN: [id number] DOB: 08-Jul-1975 Today's Date: 06/22/2012 Time: 7829-5621 PT Time Calculation (min): 19 min  PT Assessment / Plan / Recommendation Clinical Impression  Pt s/p L4-5 discectomy revision. Pt was on bedrest for 24 hrs and is now up ambulating indepenently. Pt educated on safety while maintaining back precautions. No further PT needs, will not follow    PT Assessment  Patent does not need any further PT services    Follow Up Recommendations  No PT follow up    Does the patient have the potential to tolerate intense rehabilitation      Barriers to Discharge        Equipment Recommendations  None recommended by PT    Recommendations for Other Services     Frequency      Precautions / Restrictions Precautions Precautions: Back Restrictions Weight Bearing Restrictions: No   Pertinent Vitals/Pain Minimal pain complaints. Pain meds given prior to session.       Mobility  Bed Mobility Bed Mobility: Rolling Right;Right Sidelying to Sit;Sitting - Scoot to [place] of Bed Rolling Right: 5: Supervision Right Sidelying to Sit: 5: Supervision Sitting - Scoot to Edge of Bed: 5: Supervision Details for Bed Mobility Assistance: VC for safe technique in/out of bed to maintain back precautions Transfers Transfers: Sit to Stand;Stand to Sit Sit to Stand: 5: Supervision Stand to Sit: 5: Supervision Details for Transfer Assistance: Increased time to complete secondary to hip pain. No assist needed Ambulation/Gait Ambulation/Gait Assistance: 5: Supervision Ambulation Distance (Feet): 200 Feet Assistive device: None Gait Pattern: Within Functional Limits Gait velocity: normal    Shoulder Instructions     Exercises     PT Diagnosis:    PT Problem List:   PT Treatment Interventions:     PT Goals    Visit Information  Last PT Received On: 06/22/12 Assistance Needed: +1    Subjective Data      Prior  Functioning  Home Living Lives With: Spouse;Daughter;Son Available Help at Discharge: Family;Available 24 hours/day Type of Home: Apartment Home Access: Stairs to enter Entrance Stairs-Number of Steps: 1 Entrance Stairs-Rails: None Home Layout: One level Bathroom Shower/Tub: Forensic scientist: Standard Bathroom Accessibility: Yes How Accessible: Accessible via walker Home Adaptive Equipment: None Prior Function Level of Independence: Independent Able to Take Stairs?: Yes Driving: Yes Vocation: Unemployed Communication Communication: No difficulties Dominant Hand: Left    Cognition  Overall Cognitive Status: Appears within functional limits for tasks assessed/performed Arousal/Alertness: Awake/alert Orientation Level: Appears intact for tasks assessed Behavior During Session: [facility] for tasks performed    Extremity/Trunk Assessment Right Lower Extremity Assessment RLE ROM/Strength/Tone: Within functional levels RLE Sensation: WFL - Light Touch Left Lower Extremity Assessment LLE ROM/Strength/Tone: Within functional levels LLE Sensation: WFL - Light Touch   Balance    End of Session PT - End of Session Activity Tolerance: Patient tolerated treatment well Patient left: Other (comment) (in room getting ready for d/c) Nurse Communication: Mobility status  GP     Milana Kidney 06/22/2012, 10:04 AM  06/22/2012 Milana Kidney DPT PAGER: [phone] OFFICE: [phone]

## 2012-06-22 NOTE — Progress Notes (Signed)
According to MD note, bedrest discontinued and pt is able to ambulate.

## 2012-06-23 NOTE — Care Management Note (Signed)
    Page 1 of 1   06/23/2012     9:24:22 AM   CARE MANAGEMENT NOTE 06/23/2012  Patient:  Kelly Torres, Kelly Torres   Account Number:  [id number]  Date Initiated:  06/19/2012  Documentation initiated by:  Jacquelynn Cree  Subjective/Objective Assessment:   Admitted postop L4-5 discectomy redo     Action/Plan:   currently on flat bedrest   Anticipated DC Date:  06/22/2012   Anticipated DC Plan:  HOME/SELF CARE      DC Planning Services  CM consult      Choice offered to / List presented to:             Status of service:  Completed, signed off Medicare Important Message given?   (If response is "NO", the following Medicare IM given date fields will be blank) Date Medicare IM given:   Date Additional Medicare IM given:    Discharge Disposition:  HOME/SELF CARE  Per UR Regulation:  Reviewed for med. necessity/level of care/duration of stay  If discussed at Long Length of Stay Meetings, dates discussed:    Comments:

## 2012-06-26 LAB — CULTURE, BLOOD (ROUTINE X 2)
Culture: NO GROWTH
Culture: NO GROWTH

## 2013-06-11 DIAGNOSIS — I639 Cerebral infarction, unspecified: Secondary | ICD-10-CM

## 2013-06-11 HISTORY — DX: Cerebral infarction, unspecified: I63.9

## 2014-06-11 DIAGNOSIS — I6990 Unspecified sequelae of unspecified cerebrovascular disease: Secondary | ICD-10-CM

## 2014-06-11 HISTORY — DX: Unspecified sequelae of unspecified cerebrovascular disease: I69.90

## 2016-09-13 ENCOUNTER — Emergency Department (HOSPITAL_COMMUNITY)
Admission: EM | Admit: 2016-09-13 | Discharge: 2016-09-13 | Disposition: A | Discharge status: Home / Self Care | Payer: Medicaid Other | Attending: Emergency Medicine | Admitting: Emergency Medicine

## 2016-09-13 ENCOUNTER — Emergency Department (HOSPITAL_COMMUNITY): Payer: Medicaid Other

## 2016-09-13 ENCOUNTER — Encounter (HOSPITAL_COMMUNITY): Payer: Self-pay | Admitting: Emergency Medicine

## 2016-09-13 DIAGNOSIS — Y9301 Activity, walking, marching and hiking: Secondary | ICD-10-CM | POA: Diagnosis not present

## 2016-09-13 DIAGNOSIS — S93601A Unspecified sprain of right foot, initial encounter: Secondary | ICD-10-CM | POA: Insufficient documentation

## 2016-09-13 DIAGNOSIS — Y999 Unspecified external cause status: Secondary | ICD-10-CM | POA: Diagnosis not present

## 2016-09-13 DIAGNOSIS — Y929 Unspecified place or not applicable: Secondary | ICD-10-CM | POA: Insufficient documentation

## 2016-09-13 DIAGNOSIS — Z79899 Other long term (current) drug therapy: Secondary | ICD-10-CM | POA: Diagnosis not present

## 2016-09-13 DIAGNOSIS — X501XXA Overexertion from prolonged static or awkward postures, initial encounter: Secondary | ICD-10-CM | POA: Diagnosis not present

## 2016-09-13 DIAGNOSIS — S99921A Unspecified injury of right foot, initial encounter: Secondary | ICD-10-CM | POA: Diagnosis present

## 2016-09-13 HISTORY — DX: Acute embolism and thrombosis of unspecified deep veins of unspecified lower extremity: I82.409

## 2016-09-13 MED ORDER — IBUPROFEN 800 MG PO TABS: 800.0000 mg | ORAL_TABLET | Freq: Three times a day (TID) | ORAL | 0 refills | Status: DC

## 2016-09-13 MED ORDER — HYDROCODONE-ACETAMINOPHEN 5-325 MG PO TABS: ORAL_TABLET | ORAL | 0 refills | Status: DC

## 2016-09-13 NOTE — ED Triage Notes (Signed)
Patient c/o right foot pain. Per patient twisted foot inward while walking this morning. Patient states "I was fine right after I did it but after I got to work and set a while, I tried to stand up and couldn't bear any weight on that foot because of the pain. Per patient some swelling.

## 2016-09-13 NOTE — Discharge Instructions (Signed)
Elevate and apply ice packs on/off to your foot.  Follow-up with the orthopedic doctor listed in one week if not improved

## 2016-09-13 NOTE — ED Notes (Signed)
Patient given discharge instruction, verbalized understand.  Patient in wheelchair out of the department.  

## 2016-09-13 NOTE — ED Provider Notes (Signed)
[facility] DEPT Provider Note   CSN: [id number] Arrival date & time: 09/13/16  1313     History   Chief Complaint Chief Complaint  Patient presents with  . Foot Injury    HPI Kelly Torres is a 41 y.o. female.  HPI   Kelly Torres is a 41 y.o. female who presents to the Emergency Department complaining of pain to the right foot.  She reports a "twisting" injury to her foot several hours prior to arrival.  She states that she did not fall, but developed increasing pain to her foot after walking on it.  She denies numbness, significant swelling, and pain to the ankle.     Past Medical History:  Diagnosis Date  . Anemia   . Arthritis    back  . Constipation - functional   . DVT (deep venous thrombosis) ([place])   . GERD (gastroesophageal reflux disease)   . Hernia, abdominal   . Hx of migraines    last 08/2011  . Lumbar herniated disc    pain radiates down right leg  . Menometrorrhagia   . Spinal headache     Patient Active Problem List   Diagnosis Date Noted  . heavy periods 11/06/2011    Past Surgical History:  Procedure Laterality Date  . ANTERIOR CRUCIATE LIGAMENT REPAIR  2001   right knee, Dr. Aline Brochure, [facility]  . CESAREAN SECTION  2002, 2005   [place]  . ENDOMETRIAL ABLATION    . LUMBAR LAMINECTOMY/DECOMPRESSION MICRODISCECTOMY  01/10/2012   Procedure: LUMBAR LAMINECTOMY/DECOMPRESSION MICRODISCECTOMY 1 LEVEL;  Surgeon: Ophelia Charter, MD;  Location: [facility];  Service: Neurosurgery;  Laterality: Right;  RIGHT Lumbar Four-Five Diskectomy  . LUMBAR LAMINECTOMY/DECOMPRESSION MICRODISCECTOMY  06/18/2012   Procedure: LUMBAR LAMINECTOMY/DECOMPRESSION MICRODISCECTOMY 1 LEVEL;  Surgeon: Ophelia Charter, MD;  Location: [place] [facility];  Service: Neurosurgery;  Laterality: Right;  Redo Right Lumbar Four-Five Diskectomy  . TONSILLECTOMY  age 65    OB History    Gravida Para Term Preterm AB Living   4 3 3   1 3    SAB TAB Ectopic Multiple Live Births                   Home Medications    Prior to Admission medications   Medication Sig Start Date End Date Taking? Authorizing Provider  Cyanocobalamin (B-12) 1000 MCG CAPS Take 1 tablet by mouth daily.      Historical Provider, MD  diazepam (VALIUM) 5 MG tablet Take 1 tablet (5 mg total) by mouth every 6 (six) hours as needed. 06/20/12   Newman Pies, MD  diclofenac (VOLTAREN) 25 MG EC tablet Take 25 mg by mouth 2 (two) times daily.    Historical Provider, MD  diclofenac sodium (VOLTAREN) 1 % GEL Apply 2 g topically 2 (two) times daily.    Historical Provider, MD  omeprazole (PRILOSEC) 40 MG capsule Take 40 mg by mouth daily.      Historical Provider, MD  oxyCODONE-acetaminophen (PERCOCET) 10-325 MG per tablet Take 1 tablet by mouth every 4 (four) hours as needed. For pain    Historical Provider, MD  oxyCODONE-acetaminophen (PERCOCET) 10-325 MG per tablet Take 1 tablet by mouth every 4 (four) hours as needed for pain. 06/20/12   Newman Pies, MD    Family History No family history on file.  Social History Social History  Substance Use Topics  . Smoking status: Never Smoker  . Smokeless tobacco: Never Used  . Alcohol use No  Allergies   Latex   Review of Systems Review of Systems  Constitutional: Negative for chills and fever.  Musculoskeletal: Positive for arthralgias (lateral right foot pain). Negative for joint swelling.  Skin: Negative for color change and wound.  Neurological: Negative for weakness and numbness.  All other systems reviewed and are negative.    Physical Exam Updated Vital Signs BP 136/73 (BP Location: Left Arm)   Pulse 60   Temp 98.2 F (36.8 C) (Oral)   Resp 16   Ht 6\' 1"  (1.854 m)   Wt (!) 154.2 kg   LMP 08/16/2016   SpO2 99%   BMI 44.86 kg/m   Physical Exam  Constitutional: She is oriented to person, place, and time. She appears well-developed and well-nourished. No distress.  HENT:  Head: Normocephalic and atraumatic.  Cardiovascular:  Normal rate, regular rhythm, normal heart sounds and intact distal pulses.   Pulmonary/Chest: Effort normal and breath sounds normal.  Musculoskeletal: She exhibits tenderness. She exhibits no edema or deformity.  ttp of the lateral right foot.  No edema, erythema,  bruising or bony deformity.  No proximal tenderness.  Compartments soft  Neurological: She is alert and oriented to person, place, and time. She exhibits normal muscle tone. Coordination normal.  Skin: Skin is warm and dry.  Nursing note and vitals reviewed.    ED Treatments / Results  Labs (all labs ordered are listed, but only abnormal results are displayed) Labs Reviewed - No data to display  EKG  EKG Interpretation None       Radiology Dg Foot Complete Right  Result Date: 09/13/2016 CLINICAL DATA:  Twisting injury while walking today. Pain and swelling. EXAM: RIGHT FOOT COMPLETE - 3+ VIEW COMPARISON:  None. FINDINGS: Mild midfoot osteoarthritis.  No acute or traumatic finding. IMPRESSION: Negative. Electronically Signed   By: Nelson Chimes M.D.   On: 09/13/2016 14:05    Procedures Procedures (including critical care time)  Medications Ordered in ED Medications - No data to display   Initial Impression / Assessment and Plan / ED Course  I have reviewed the triage vital signs and the nursing notes.  Pertinent labs & imaging results that were available during my care of the patient were reviewed by me and considered in my medical decision making (see chart for details).     Pt has a family member with a walker that she agrees to use as needed.  NV intact.  Likely sprain.  Given RICE instructions agrees to orthopedic f/u in one week if not improving  Pt reviewed on the [place], no recent narcotics on file.  Final Clinical Impressions(s) / ED Diagnoses   Final diagnoses:  Sprain of right foot, initial encounter    New Prescriptions New Prescriptions   No medications on file     Bufford Lope 09/14/16 [place], MD 09/15/16 [phone]

## 2017-09-16 ENCOUNTER — Ambulatory Visit: Payer: BLUE CROSS/BLUE SHIELD | Attending: Family Medicine | Admitting: Physical Therapy

## 2017-09-16 ENCOUNTER — Other Ambulatory Visit: Payer: Self-pay

## 2017-09-16 DIAGNOSIS — M5416 Radiculopathy, lumbar region: Secondary | ICD-10-CM | POA: Diagnosis not present

## 2017-09-16 NOTE — Therapy (Signed)
[facility] [place], [place], [address] Phone: [phone]   Fax:  [phone]  Physical Therapy Evaluation  Patient Details  Name: Kelly Torres MRN: [id number] Date of Birth: [birth year] Referring Provider: Harland German MD   Encounter Date: 09/16/2017  PT End of Session - 09/16/17 1308    Visit Number  1    Number of Visits  16    Date for PT Re-Evaluation  11/11/17    PT Start Time  8101    PT Stop Time  1346    PT Time Calculation (min)  38 min    Activity Tolerance  Patient tolerated treatment well    Behavior During Therapy  [facility] for tasks assessed/performed       Past Medical History:  Diagnosis Date  . Anemia   . Arthritis    back  . Constipation - functional   . DVT (deep venous thrombosis) ([place])   . GERD (gastroesophageal reflux disease)   . Hernia, abdominal   . Hx of migraines    last 08/2011  . Lumbar herniated disc    pain radiates down right leg  . Menometrorrhagia   . Spinal headache     Past Surgical History:  Procedure Laterality Date  . ANTERIOR CRUCIATE LIGAMENT REPAIR  2001   right knee, Dr. Aline Brochure, [facility]  . CESAREAN SECTION  2002, 2005   [place]  . ENDOMETRIAL ABLATION    . LUMBAR LAMINECTOMY/DECOMPRESSION MICRODISCECTOMY  01/10/2012   Procedure: LUMBAR LAMINECTOMY/DECOMPRESSION MICRODISCECTOMY 1 LEVEL;  Surgeon: Ophelia Charter, MD;  Location: [place] [facility];  Service: Neurosurgery;  Laterality: Right;  RIGHT Lumbar Four-Five Diskectomy  . LUMBAR LAMINECTOMY/DECOMPRESSION MICRODISCECTOMY  06/18/2012   Procedure: LUMBAR LAMINECTOMY/DECOMPRESSION MICRODISCECTOMY 1 LEVEL;  Surgeon: Ophelia Charter, MD;  Location: [place] [facility];  Service: Neurosurgery;  Laterality: Right;  Redo Right Lumbar Four-Five Diskectomy  . TONSILLECTOMY  age 42    There were no vitals filed for this visit.   Subjective Assessment - 09/16/17 1315    Subjective  Patient started a part time job at [employer] part  time in December to supplement her full time job. By end of December she was having stabbing pain in low back in the morning. She stood for hours at a time.  She had to quit. Now she has pain in the back of her leg and thigh on the R side and extends from low back to mid back.  He full time job is a seated job at a computer. Unable to sleep on R side.     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  1.5 hour    How long can you stand comfortably?  1 hour    How long can you walk comfortably?  30 min    Patient Stated Goals  to get rid of pain.    Currently in Pain?  Yes    Pain Score  6     Pain Location  Back    Pain Orientation  Right    Pain Descriptors / Indicators  Dull;Cramping    Pain Type  Acute pain    Pain Radiating Towards  dull cramp in calf;     Pain Onset  More than a month ago    Pain Frequency  Constant    Aggravating  Factors   walking, sleeping on R side    Pain Relieving Factors  can calm it with hot water, icy/hot         [facility] PT Assessment - 09/16/17 0001      Assessment   Medical Diagnosis  acute R sided LBP    Referring Provider  Harland German MD    Onset Date/Surgical Date  05/25/18    Next MD Visit  11/11/17      Precautions   Precautions  Other (comment)    Precaution Comments  latex allergy; avoid lumbar flexion      Restrictions   Weight Bearing Restrictions  No      Balance Screen   Has the patient fallen in the past 6 months  No    Has the patient had a decrease in activity level because of a fear of falling?   No    Is the patient reluctant to leave their home because of a fear of falling?   No      Home Film/video editor residence    Living Arrangements  Spouse/significant other    Home Access  Stairs to enter    Entrance Stairs-Number of Steps  1    [place]  One  level      Prior Function   Level of Independence  Independent    Vocation  Full time employment    Vocation Requirements  sitting, 2 flights of stairs       Observation/Other Assessments   Skin Integrity  patient has darkened area of skin on R lower leg near ankle with mild warmth and tenderness; Advised pt to monitor size and temp and go to MD if worsens.    Focus on Therapeutic Outcomes (FOTO)   56 % limited      Posture/Postural Control   Posture/Postural Control  Postural limitations    Postural Limitations  Anterior pelvic tilt;Weight shift left      ROM / Strength   AROM / PROM / Strength  AROM;Strength      AROM   AROM Assessment Site  Lumbar    Lumbar Flexion  hands to knees    Lumbar Extension  full    Lumbar - Right Side [facility] but pain in R low back    Lumbar - Left Side Bend  WFL pull in R low back    Lumbar - Right Rotation  full    Lumbar - Left Rotation  pulls and 25% decreased      Strength   Overall Strength Comments  Grossly 5/5 in RLE except ankle eversion 4/5 cog wheel      Palpation   Palpation comment  tender in R gluteals, piriformis, lumbar paraspinals, R QL      Special Tests    Special Tests  Lumbar    Lumbar Tests  Slump Test;Straight Leg Raise      Slump test   Findings  Negative    Side  Right      Straight Leg Raise   Findings  Positive    Side   Right                Objective measurements completed on examination: See above findings.      [facility] Adult PT Treatment/Exercise - 09/16/17 0001      Exercises   Exercises  Lumbar      Lumbar Exercises: Prone   Other Prone Lumbar Exercises  prone  lying x 2 min centralizes sx to L hip and back; POE x 3 min decreases pain in hip; LBP 5-6/10             PT Education - 09/16/17 1539    Education provided  Yes    Education Details  Explanation of centralization of symptoms; modifying ADLS to avoid flexion; seating recommendations to avoid flexion    Person(s) Educated   Patient    Methods  Explanation    Comprehension  Verbalized understanding;Returned demonstration prone and POE performed       PT Short Term Goals - 09/16/17 1553      PT SHORT TERM GOAL #1   Title  Patient to be independent in techniques to centralize LE radicular symptoms.    Time  4    Period  Weeks    Target Date  10/14/17        PT Long Term Goals - 09/16/17 1551      PT LONG TERM GOAL #1   Title  I with advanced HEP    Time  8    Period  Weeks    Status  New    Target Date  11/11/17      PT LONG TERM GOAL #2   Title  Patient able to peform ADLS with no decentralization of symptoms into RLE.    Time  8    Period  Weeks    Status  New      PT LONG TERM GOAL #3   Title  Patient able to sit at work with intermittent breaks with pain 2/10 or less in the low back.    Time  8    Period  Weeks    Status  New      PT LONG TERM GOAL #4   Title  Patient able to walk community distances without pain in the RLE and low back pain of 2/10 or less.    Time  8    Period  Weeks    Status  New             Plan - 09/16/17 1546    Clinical Impression Statement  Patient presents for low complextiy evaluation for low back pain with radiculopathy that is limiting her sitting and walking tolerance and affecting ADLs including involving bending and lifting. She has inceased pain and symptoms with flexion, weakness in her R ankle and is able to centralize symptoms in prone and POE. Patient will benefit from PT to address deficits.    History and Personal Factors relevant to plan of care:  CVA, blood clot, 2 lumbar laminectomies L4/5, 2 C-sections    Clinical Presentation  Stable    Clinical Decision Making  Low    Rehab Potential  Good    PT Frequency  2x / week    PT Duration  8 weeks    PT Treatment/Interventions  ADLs/Self Care Home Management;Electrical Stimulation;Cryotherapy;Moist Heat;Traction;Ultrasound;Patient/family education;Neuromuscular re-education;Therapeutic  exercise;Manual techniques;Taping;Dry needling    PT Next Visit Plan  Extension protocol, core stab, ADL modification, lifting/body mechanics; modalities prn    PT Home Exercise Plan  POE x 5 min every 2 hours or as needed    Consulted and Agree with Plan of Care  Patient       Patient will benefit from skilled therapeutic intervention in order to improve the following deficits and impairments:  Decreased activity tolerance, Decreased strength, Pain, Postural dysfunction  Visit Diagnosis: Radiculopathy, lumbar region - Plan: PT  plan of care cert/re-cert     Problem List Patient Active Problem List   Diagnosis Date Noted  . heavy periods 11/06/2011    Madelyn Flavors PT 09/16/2017, 3:57 PM  [facility] [place], [place], [address] Phone: [phone]   Fax:  [phone]  Name: Kelly Torres MRN: [id number] Date of Birth: 1975/09/30

## 2017-09-19 ENCOUNTER — Ambulatory Visit: Payer: BLUE CROSS/BLUE SHIELD | Admitting: Physical Therapy

## 2017-09-19 ENCOUNTER — Encounter: Payer: Self-pay | Admitting: Physical Therapy

## 2017-09-19 DIAGNOSIS — M5416 Radiculopathy, lumbar region: Secondary | ICD-10-CM

## 2017-09-19 NOTE — Therapy (Addendum)
[facility] [place], [place], [address] Phone: [phone]   Fax:  [phone]  Physical Therapy Treatment and Discharge Summary  Patient Details  Name: Kelly Torres MRN: [id number] Date of Birth: [birth year] Referring Provider: Harland German MD   Encounter Date: 09/19/2017  PT End of Session - 09/19/17 1354    Visit Number  2    Number of Visits  16    Date for PT Re-Evaluation  11/11/17    PT Start Time  2482    PT Stop Time  1439    PT Time Calculation (min)  45 min    Activity Tolerance  Patient tolerated treatment well    Behavior During Therapy  [facility] for tasks assessed/performed       Past Medical History:  Diagnosis Date  . Anemia   . Arthritis    back  . Constipation - functional   . DVT (deep venous thrombosis) ([place])   . GERD (gastroesophageal reflux disease)   . Hernia, abdominal   . Hx of migraines    last 08/2011  . Lumbar herniated disc    pain radiates down right leg  . Menometrorrhagia   . Spinal headache     Past Surgical History:  Procedure Laterality Date  . ANTERIOR CRUCIATE LIGAMENT REPAIR  2001   right knee, Dr. Aline Brochure, [facility]  . CESAREAN SECTION  2002, 2005   [place]  . ENDOMETRIAL ABLATION    . LUMBAR LAMINECTOMY/DECOMPRESSION MICRODISCECTOMY  01/10/2012   Procedure: LUMBAR LAMINECTOMY/DECOMPRESSION MICRODISCECTOMY 1 LEVEL;  Surgeon: Ophelia Charter, MD;  Location: [place] [facility];  Service: Neurosurgery;  Laterality: Right;  RIGHT Lumbar Four-Five Diskectomy  . LUMBAR LAMINECTOMY/DECOMPRESSION MICRODISCECTOMY  06/18/2012   Procedure: LUMBAR LAMINECTOMY/DECOMPRESSION MICRODISCECTOMY 1 LEVEL;  Surgeon: Ophelia Charter, MD;  Location: [place] [facility];  Service: Neurosurgery;  Laterality: Right;  Redo Right Lumbar Four-Five Diskectomy  . TONSILLECTOMY  age 42    There were no vitals filed for this visit.  Subjective Assessment - 09/19/17 1351    Subjective  Reports that her back pain is  not constant but reports that she has a trip to [place] coming up as well.    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  1.5 hour    How long can you stand comfortably?  1 hour    How long can you walk comfortably?  30 min    Patient Stated Goals  to get rid of pain.    Currently in Pain?  Yes    Pain Score  5     Pain Location  Back    Pain Orientation  Lower    Pain Descriptors / Indicators  Nagging;Cramping    Pain Type  Acute pain    Pain Radiating Towards  R calf    Pain Onset  More than a month ago    Pain Frequency  Intermittent         [facility] PT Assessment - 09/19/17 0001      Assessment   Medical Diagnosis  acute R sided LBP    Onset Date/Surgical Date  05/25/18    Next MD Visit  11/11/17      Precautions   Precautions  Other (comment)    Precaution Comments  latex allergy; avoid lumbar flexion  Restrictions   Weight Bearing Restrictions  No                   [facility] Adult PT Treatment/Exercise - 09/19/17 0001      Lumbar Exercises: Stretches   Passive Hamstring Stretch  Right;3 reps;30 seconds    Single Knee to Chest Stretch  Right;3 reps;30 seconds with towel    Figure 4 Stretch  3 reps;30 seconds;Supine;Without overpressure      Lumbar Exercises: Supine   Ab Set  15 reps;5 seconds      Lumbar Exercises: Prone   Other Prone Lumbar Exercises  POE x2 min, POE with R roadkill x2 min, Prone press ups x15 reps      Modalities   Modalities  Electrical Stimulation;Moist Heat completed in prone      Moist Heat Therapy   Number Minutes Moist Heat  15 Minutes    Moist Heat Location  Lumbar Spine      Electrical Stimulation   Electrical Stimulation Location  B low back    Electrical Stimulation Action  Pre-Mod    Electrical Stimulation Parameters  80-150 hz x15 min    Electrical Stimulation Goals   Pain               PT Short Term Goals - 09/16/17 1553      PT SHORT TERM GOAL #1   Title  Patient to be independent in techniques to centralize LE radicular symptoms.    Time  4    Period  Weeks    Target Date  10/14/17        PT Long Term Goals - 09/16/17 1551      PT LONG TERM GOAL #1   Title  I with advanced HEP    Time  8    Period  Weeks    Status  New    Target Date  11/11/17      PT LONG TERM GOAL #2   Title  Patient able to peform ADLS with no decentralization of symptoms into RLE.    Time  8    Period  Weeks    Status  New      PT LONG TERM GOAL #3   Title  Patient able to sit at work with intermittent breaks with pain 2/10 or less in the low back.    Time  8    Period  Weeks    Status  New      PT LONG TERM GOAL #4   Title  Patient able to walk community distances without pain in the RLE and low back pain of 2/10 or less.    Time  8    Period  Weeks    Status  New            Plan - 09/19/17 1431    Clinical Impression Statement  Patient presented in clinic today with reports of LBP being better as well as cramping radiating pain in R HS. Patient guided through modified supine low back and hip stretching using towels and gait belts to accomplish desired stretch. Patient also educated regarding proper core activation technique to strengthen core and low back. No complaints with POE or prone pressups but after 2 min pain radiated from R proximal HS to mid HS region with POE R roadkill. Normal modalities response noted following removal of the modalities in prone. Patient educated regarding lumbar roll for long drives or riding as well to maintain natural spine  curves.    Rehab Potential  Good    PT Frequency  2x / week    PT Duration  8 weeks    PT Treatment/Interventions  ADLs/Self Care Home Management;Electrical Stimulation;Cryotherapy;Moist Heat;Traction;Ultrasound;Patient/family education;Neuromuscular re-education;Therapeutic exercise;Manual  techniques;Taping;Dry needling    PT Next Visit Plan  Extension protocol, core stab, ADL modification, lifting/body mechanics; modalities prn    PT Home Exercise Plan  POE x 5 min every 2 hours or as needed    Consulted and Agree with Plan of Care  Patient       Patient will benefit from skilled therapeutic intervention in order to improve the following deficits and impairments:  Decreased activity tolerance, Decreased strength, Pain, Postural dysfunction  Visit Diagnosis: Radiculopathy, lumbar region     Problem List Patient Active Problem List   Diagnosis Date Noted  . heavy periods 11/06/2011    Standley Brooking, PTA 09/19/2017, 2:52 PM  [facility] [address] [place], [place], [address] Phone: [phone]   Fax:  [phone]  Name: GAVRIELLA HEARST MRN: [id number] Date of Birth: 24-Jun-1975  PHYSICAL THERAPY DISCHARGE SUMMARY  Visits from Start of Care: 2  Current functional level related to goals / functional outcomes: unknown   Remaining deficits: unknown   Education / Equipment: HEP  Plan: Patient agrees to discharge.  Patient goals were not met. Patient is being discharged due to not returning since the last visit.  ?????     Madelyn Flavors, PT 03/24/20 7:55 AM [facility] [address] [place], [place], [address] Phone: [phone]   Fax:  [phone]

## 2017-10-01 ENCOUNTER — Encounter: Payer: BLUE CROSS/BLUE SHIELD | Admitting: Physical Therapy

## 2018-10-01 ENCOUNTER — Inpatient Hospital Stay (HOSPITAL_COMMUNITY)
Admission: EM | Admit: 2018-10-01 | Discharge: 2018-10-03 | DRG: 065 | Disposition: A | Discharge status: Home / Self Care | Payer: Medicaid Other | Attending: Family Medicine | Admitting: Family Medicine

## 2018-10-01 ENCOUNTER — Observation Stay (HOSPITAL_COMMUNITY): Payer: Medicaid Other

## 2018-10-01 ENCOUNTER — Encounter (HOSPITAL_COMMUNITY): Payer: Self-pay | Admitting: *Deleted

## 2018-10-01 ENCOUNTER — Other Ambulatory Visit: Payer: Self-pay

## 2018-10-01 ENCOUNTER — Emergency Department (HOSPITAL_COMMUNITY): Payer: Medicaid Other

## 2018-10-01 DIAGNOSIS — G459 Transient cerebral ischemic attack, unspecified: Secondary | ICD-10-CM | POA: Diagnosis not present

## 2018-10-01 DIAGNOSIS — I351 Nonrheumatic aortic (valve) insufficiency: Secondary | ICD-10-CM | POA: Diagnosis present

## 2018-10-01 DIAGNOSIS — Z9114 Patient's other noncompliance with medication regimen: Secondary | ICD-10-CM | POA: Diagnosis not present

## 2018-10-01 DIAGNOSIS — R200 Anesthesia of skin: Secondary | ICD-10-CM | POA: Diagnosis present

## 2018-10-01 DIAGNOSIS — Z86718 Personal history of other venous thrombosis and embolism: Secondary | ICD-10-CM

## 2018-10-01 DIAGNOSIS — Z91120 Patient's intentional underdosing of medication regimen due to financial hardship: Secondary | ICD-10-CM

## 2018-10-01 DIAGNOSIS — G8324 Monoplegia of upper limb affecting left nondominant side: Secondary | ICD-10-CM | POA: Diagnosis present

## 2018-10-01 DIAGNOSIS — G4733 Obstructive sleep apnea (adult) (pediatric): Secondary | ICD-10-CM | POA: Diagnosis present

## 2018-10-01 DIAGNOSIS — E7800 Pure hypercholesterolemia, unspecified: Secondary | ICD-10-CM | POA: Diagnosis present

## 2018-10-01 DIAGNOSIS — R29898 Other symptoms and signs involving the musculoskeletal system: Secondary | ICD-10-CM | POA: Diagnosis present

## 2018-10-01 DIAGNOSIS — Z8673 Personal history of transient ischemic attack (TIA), and cerebral infarction without residual deficits: Secondary | ICD-10-CM

## 2018-10-01 DIAGNOSIS — G40909 Epilepsy, unspecified, not intractable, without status epilepticus: Secondary | ICD-10-CM | POA: Diagnosis present

## 2018-10-01 DIAGNOSIS — R29700 NIHSS score 0: Secondary | ICD-10-CM | POA: Diagnosis present

## 2018-10-01 DIAGNOSIS — K219 Gastro-esophageal reflux disease without esophagitis: Secondary | ICD-10-CM | POA: Diagnosis present

## 2018-10-01 DIAGNOSIS — I639 Cerebral infarction, unspecified: Secondary | ICD-10-CM

## 2018-10-01 DIAGNOSIS — Z6841 Body Mass Index (BMI) 40.0 and over, adult: Secondary | ICD-10-CM

## 2018-10-01 DIAGNOSIS — Z91040 Latex allergy status: Secondary | ICD-10-CM

## 2018-10-01 DIAGNOSIS — M1990 Unspecified osteoarthritis, unspecified site: Secondary | ICD-10-CM | POA: Diagnosis present

## 2018-10-01 DIAGNOSIS — D649 Anemia, unspecified: Secondary | ICD-10-CM | POA: Diagnosis present

## 2018-10-01 DIAGNOSIS — E785 Hyperlipidemia, unspecified: Secondary | ICD-10-CM | POA: Diagnosis present

## 2018-10-01 HISTORY — DX: Unspecified sequelae of unspecified cerebrovascular disease: I69.90

## 2018-10-01 HISTORY — DX: Cerebral infarction, unspecified: I63.9

## 2018-10-01 HISTORY — DX: Sciatica, right side: M54.31

## 2018-10-01 HISTORY — DX: Low back pain: M54.5

## 2018-10-01 HISTORY — DX: Low back pain, unspecified: M54.50

## 2018-10-01 HISTORY — DX: Other chronic pain: G89.29

## 2018-10-01 HISTORY — DX: Pure hypercholesterolemia, unspecified: E78.00

## 2018-10-01 HISTORY — DX: Headache: R51

## 2018-10-01 LAB — COMPREHENSIVE METABOLIC PANEL
ALT: 18 U/L (ref 0–44)
AST: 22 U/L (ref 15–41)
Albumin: 3.7 g/dL (ref 3.5–5.0)
Alkaline Phosphatase: 44 U/L (ref 38–126)
Anion gap: 7 (ref 5–15)
BUN: 17 mg/dL (ref 6–20)
CO2: 23 mmol/L (ref 22–32)
Calcium: 8.3 mg/dL — ABNORMAL LOW (ref 8.9–10.3)
Chloride: 106 mmol/L (ref 98–111)
Creatinine, Ser: 1.05 mg/dL — ABNORMAL HIGH (ref 0.44–1.00)
GFR calc Af Amer: >60 mL/min (ref >60)
GFR calc non Af Amer: >60 mL/min (ref >60)
Glucose, Bld: 102 mg/dL — ABNORMAL HIGH (ref 70–99)
Potassium: 3.2 mmol/L — ABNORMAL LOW (ref 3.5–5.1)
Sodium: 136 mmol/L (ref 135–145)
Total Bilirubin: 0.4 mg/dL (ref 0.3–1.2)
Total Protein: 7.3 g/dL (ref 6.5–8.1)

## 2018-10-01 LAB — URINALYSIS, ROUTINE W REFLEX MICROSCOPIC
Bilirubin Urine: NEGATIVE
Glucose, UA: NEGATIVE mg/dL
Hgb urine dipstick: NEGATIVE
Ketones, ur: NEGATIVE mg/dL
Leukocytes,Ua: NEGATIVE
Nitrite: NEGATIVE
Protein, ur: NEGATIVE mg/dL
Specific Gravity, Urine: 1.003 — ABNORMAL LOW (ref 1.005–1.030)
pH: 6 (ref 5.0–8.0)

## 2018-10-01 LAB — RAPID URINE DRUG SCREEN, HOSP PERFORMED
Amphetamines: NOT DETECTED
Barbiturates: NOT DETECTED
Benzodiazepines: NOT DETECTED
Cocaine: NOT DETECTED
Opiates: NOT DETECTED
Tetrahydrocannabinol: NOT DETECTED

## 2018-10-01 LAB — CBC
HCT: 37.5 % (ref 36.0–46.0)
Hemoglobin: 11.8 g/dL — ABNORMAL LOW (ref 12.0–15.0)
MCH: 26.1 pg (ref 26.0–34.0)
MCHC: 31.5 g/dL (ref 30.0–36.0)
MCV: 83 fL (ref 80.0–100.0)
Platelets: 208 10*3/uL (ref 150–400)
RBC: 4.52 MIL/uL (ref 3.87–5.11)
RDW: 15.4 % (ref 11.5–15.5)
WBC: 6.9 10*3/uL (ref 4.0–10.5)
nRBC: 0 % (ref 0.0–0.2)

## 2018-10-01 LAB — DIFFERENTIAL
Abs Immature Granulocytes: 0.02 10*3/uL (ref 0.00–0.07)
Basophils Absolute: 0.1 10*3/uL (ref 0.0–0.1)
Basophils Relative: 1 %
Eosinophils Absolute: 0.1 10*3/uL (ref 0.0–0.5)
Eosinophils Relative: 1 %
Immature Granulocytes: 0 %
Lymphocytes Relative: 13 %
Lymphs Abs: 0.9 10*3/uL (ref 0.7–4.0)
Monocytes Absolute: 0.5 10*3/uL (ref 0.1–1.0)
Monocytes Relative: 7 %
Neutro Abs: 5.4 10*3/uL (ref 1.7–7.7)
Neutrophils Relative %: 78 %

## 2018-10-01 LAB — CBG MONITORING, ED: Glucose-Capillary: 110 mg/dL — ABNORMAL HIGH (ref 70–99)

## 2018-10-01 LAB — TROPONIN I: Troponin I: 0.04 ng/mL (ref <0.03)

## 2018-10-01 LAB — PROTIME-INR
INR: 1 (ref 0.8–1.2)
Prothrombin Time: 12.8 seconds (ref 11.4–15.2)

## 2018-10-01 LAB — ETHANOL: Alcohol, Ethyl (B): <10 mg/dL (ref <10)

## 2018-10-01 LAB — POC URINE PREG, ED: Preg Test, Ur: NEGATIVE

## 2018-10-01 LAB — APTT: aPTT: 26 seconds (ref 24–36)

## 2018-10-01 MED ORDER — ACETAMINOPHEN 325 MG PO TABS
650.0000 mg | ORAL_TABLET | ORAL | Status: DC | PRN
  Administered 2018-10-02 (×2): 650 mg via ORAL
  Filled 2018-10-01: qty 2

## 2018-10-01 MED ORDER — ASPIRIN 325 MG PO TABS
325.0000 mg | ORAL_TABLET | Freq: Every day | ORAL | Status: DC
  Administered 2018-10-01 – 2018-10-03 (×3): 325 mg via ORAL
  Filled 2018-10-01 (×3): qty 1

## 2018-10-01 MED ORDER — PANTOPRAZOLE SODIUM 40 MG PO TBEC
40.0000 mg | DELAYED_RELEASE_TABLET | Freq: Every day | ORAL | Status: DC
  Administered 2018-10-01 – 2018-10-03 (×3): 40 mg via ORAL
  Filled 2018-10-01 (×3): qty 1

## 2018-10-01 MED ORDER — RIVAROXABAN 20 MG PO TABS
20.0000 mg | ORAL_TABLET | Freq: Every day | ORAL | Status: DC
  Administered 2018-10-01 – 2018-10-02 (×2): 20 mg via ORAL
  Filled 2018-10-01 (×2): qty 1

## 2018-10-01 MED ORDER — ACETAMINOPHEN 650 MG RE SUPP: 650.0000 mg | RECTAL | Status: DC | PRN

## 2018-10-01 MED ORDER — ALTEPLASE 100 MG IV SOLR
INTRAVENOUS | Status: AC
  Filled 2018-10-01: qty 100

## 2018-10-01 MED ORDER — SENNOSIDES-DOCUSATE SODIUM 8.6-50 MG PO TABS
1.0000 | ORAL_TABLET | Freq: Every evening | ORAL | Status: DC | PRN
  Filled 2018-10-01: qty 1

## 2018-10-01 MED ORDER — STROKE: EARLY STAGES OF RECOVERY BOOK
Freq: Once | Status: AC
  Administered 2018-10-01: 21:00:00

## 2018-10-01 MED ORDER — CYCLOBENZAPRINE HCL 10 MG PO TABS: 10.0000 mg | ORAL_TABLET | Freq: Every evening | ORAL | Status: DC | PRN

## 2018-10-01 MED ORDER — SODIUM CHLORIDE 0.9 % IV SOLN
INTRAVENOUS | Status: AC
  Administered 2018-10-01: 21:00:00 via INTRAVENOUS

## 2018-10-01 MED ORDER — ACETAMINOPHEN 160 MG/5ML PO SOLN: 650.0000 mg | ORAL | Status: DC | PRN

## 2018-10-01 NOTE — ED Notes (Signed)
ED TO INPATIENT HANDOFF REPORT  ED Nurse Name and Phone #: Kasandra Fehr [phone]  S Name/Age/Gender Kelly Torres 43 y.o. female Room/Bed: APA03/APA03  Code Status   Code Status: Not on file  Home/SNF/Other Home Patient oriented to: self, place, time and situation Is this baseline? Yes   Triage Complete: Triage complete  Chief Complaint Arm Weakness  Triage Note Pt reports some numbness and tingling in her left arm today since waking this am. 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 Latex Rash    Allergic only to condoms, no problems with elastic in clothes, gloves etc    Level of Care/Admitting Diagnosis ED Disposition    ED Disposition Condition Comment   Admit  The patient appears reasonably stabilized for admission considering the current resources, flow, and capabilities available in the ED at this time, and I doubt any other [facility] requiring further screening and/or treatment in the ED prior to admission is  present.       B Medical/Surgery History Past Medical History:  Diagnosis Date  . Anemia   . Arthritis    back  . Chronic headaches   . Constipation - functional   . DVT (deep venous thrombosis) ([place])   . GERD (gastroesophageal reflux disease)   . Hernia, abdominal   . Hx of migraines    last 08/2011  . Hypercholesterolemia   . Late effects of cerebrovascular disease 2016  . Low back pain   . Lumbar herniated disc    pain radiates down right leg  . Menometrorrhagia   . Sciatica of right side   . Spinal headache   . Stroke [facility]) 2015   Past Surgical History:  Procedure Laterality Date  . ANTERIOR CRUCIATE LIGAMENT REPAIR  2001   right knee, Dr. Aline Brochure, [facility]  . CESAREAN SECTION  2002, 2005   [place]  . ENDOMETRIAL ABLATION    . LUMBAR LAMINECTOMY/DECOMPRESSION MICRODISCECTOMY  01/10/2012   Procedure: LUMBAR LAMINECTOMY/DECOMPRESSION  MICRODISCECTOMY 1 LEVEL;  Surgeon: Ophelia Charter, MD;  Location: [place] NEURO ORS;  Service: Neurosurgery;  Laterality: Right;  RIGHT Lumbar Four-Five Diskectomy  . LUMBAR LAMINECTOMY/DECOMPRESSION MICRODISCECTOMY  06/18/2012   Procedure: LUMBAR LAMINECTOMY/DECOMPRESSION MICRODISCECTOMY 1 LEVEL;  Surgeon: Ophelia Charter, MD;  Location: [place] NEURO ORS;  Service: Neurosurgery;  Laterality: Right;  Redo Right Lumbar Four-Five Diskectomy  . TONSILLECTOMY  age 14     A IV Location/Drains/Wounds Patient Lines/Drains/Airways Status   Active Line/Drains/Airways    Name:   Placement date:   Placement time:   Site:   Days:   Peripheral IV 10/01/18 Right Antecubital   10/01/18    1721    Antecubital   less than 1   Incision 11/06/11 N/A Other (Comment)   11/06/11    1117     2521   Incision 01/10/12 Back Bilateral   01/10/12    1338     2456   Incision 06/18/12 Back Bilateral   06/18/12    1616     2296          Intake/Output Last 24 hours No intake or output data in the 24 hours ending 10/01/18 1927  Labs/Imaging Results for orders placed or performed during the hospital encounter of 10/01/18 (from the past 48 hour(s))  CBG monitoring, ED     Status: Abnormal   Collection Time: 10/01/18  5:27 PM  Result Value Ref Range   Glucose-Capillary 110 (H) 70 - 99 mg/dL  Ethanol     Status: None   Collection Time: 10/01/18  5:29 PM  Result Value Ref Range   Alcohol, Ethyl (B) <10 <10 mg/dL    Comment: (NOTE) Lowest detectable limit for serum alcohol is 10 mg/dL. For medical purposes only. Performed at [facility], [address] [place], [place] [address]   Protime-INR     Status: None   Collection Time: 10/01/18  5:29 PM  Result Value Ref Range   Prothrombin Time 12.8 11.4 - 15.2 seconds   INR 1.0 0.8 - 1.2    Comment: (NOTE) INR goal varies based on device and disease states. Performed at [facility], [address], [place], [place] [address]   APTT     Status: None   Collection Time:  10/01/18  5:29 PM  Result Value Ref Range   aPTT 26 24 - 36 seconds    Comment: Performed at [facility], [address] [place], [place] [address]  CBC     Status: Abnormal   Collection Time: 10/01/18  5:29 PM  Result Value Ref Range   WBC 6.9 4.0 - 10.5 K/uL   RBC 4.52 3.87 - 5.11 MIL/uL   Hemoglobin 11.8 (L) 12.0 - 15.0 g/dL   HCT 37.5 36.0 - 46.0 %   MCV 83.0 80.0 - 100.0 fL   MCH 26.1 26.0 - 34.0 pg   MCHC 31.5 30.0 - 36.0 g/dL   RDW 15.4 11.5 - 15.5 %   Platelets 208 150 - 400 K/uL   nRBC 0.0 0.0 - 0.2 %    Comment: Performed at [facility], [address], [place], [place] [address]  Differential     Status: None   Collection Time: 10/01/18  5:29 PM  Result Value Ref Range   Neutrophils Relative % 78 %   Neutro Abs 5.4 1.7 - 7.7 K/uL   Lymphocytes Relative 13 %   Lymphs Abs 0.9 0.7 - 4.0 K/uL   Monocytes Relative 7 %   Monocytes Absolute 0.5 0.1 - 1.0 K/uL   Eosinophils Relative 1 %   Eosinophils Absolute 0.1 0.0 - 0.5 K/uL   Basophils Relative 1 %   Basophils Absolute 0.1 0.0 - 0.1 K/uL   Immature Granulocytes 0 %   Abs Immature Granulocytes 0.02 0.00 - 0.07 K/uL    Comment: Performed at [facility], [address] [place], [place] [address]  Comprehensive metabolic panel     Status: Abnormal   Collection Time: 10/01/18  5:29 PM  Result Value Ref Range   Sodium 136 135 - 145 mmol/L   Potassium 3.2 (L) 3.5 - 5.1 mmol/L   Chloride 106 98 - 111 mmol/L   CO2 23 22 - 32 mmol/L   Glucose, Bld 102 (H) 70 - 99 mg/dL   BUN 17 6 - 20 mg/dL   Creatinine, Ser 1.05 (H) 0.44 - 1.00 mg/dL   Calcium 8.3 (L) 8.9 - 10.3 mg/dL   Total Protein 7.3 6.5 - 8.1 g/dL   Albumin 3.7 3.5 - 5.0 g/dL   AST 22 15 - 41 U/L   ALT 18 0 - 44 U/L   Alkaline Phosphatase 44 38 - 126 U/L   Total Bilirubin 0.4 0.3 - 1.2 mg/dL   GFR calc non Af Amer >60 >60 mL/min   GFR calc Af Amer >60 >60 mL/min   Anion gap 7 5 - 15  Comment: Performed at [facility], [address], [place], [place]  [address]  Troponin I - ONCE - STAT     Status: Abnormal   Collection Time: 10/01/18  5:29 PM  Result Value Ref Range   Troponin I 0.04 (HH) <0.03 ng/mL    Comment: CRITICAL RESULT CALLED TO, READ BACK BY AND VERIFIED WITH: Enrique Sack AT 1840 ON 4.22.20 BY ISLEY,B Performed at [facility], [address] [place], [place] [address]   POC urine preg, ED     Status: None   Collection Time: 10/01/18  7:08 PM  Result Value Ref Range   Preg Test, Ur NEGATIVE NEGATIVE    Comment:        THE SENSITIVITY OF THIS METHODOLOGY IS >24 mIU/mL    Mr Brain Wo Contrast (neuro Protocol)  Result Date: 10/01/2018 CLINICAL DATA:  Focal neurologic deficit. Left arm numbness and tingling today EXAM: MRI HEAD WITHOUT CONTRAST TECHNIQUE: Multiplanar, multiecho pulse sequences of the brain and surrounding structures were obtained without intravenous contrast. COMPARISON:  CT head today FINDINGS: Brain: Punctate area of restricted diffusion left frontal cortex compatible with acute infarct. This could affect speech. No other acute infarct. Chronic infarct left cerebellum as noted on CT. Small chronic infarct in the left parietal lobe. Ventricle size normal. No hemorrhage or mass lesion Vascular: Normal arterial flow voids. Skull and upper cervical spine: Negative Sinuses/Orbits: Mild mucosal edema paranasal sinuses.  Normal orbit Other: None IMPRESSION: Punctate acute infarct in the left frontal cortex. No other acute infarct. Chronic infarcts in the left cerebellum. Electronically Signed   By: Franchot Gallo M.D.   On: 10/01/2018 18:28   Mr Jodene Nam Head (cerebral Arteries)  Result Date: 10/01/2018 CLINICAL DATA:  Stroke symptoms.  Numbness tingling left arm EXAM: MRA HEAD WITHOUT CONTRAST TECHNIQUE: Angiographic images of the Circle of Willis were obtained using MRA technique without intravenous contrast. COMPARISON:  MRI head today FINDINGS: Both vertebral arteries patent to the basilar without significant stenosis. Right  PICA patent. Left PICA not visualized. Basilar widely patent. Superior cerebellar and posterior cerebral arteries patent bilaterally. Patent posterior communicating artery bilaterally. Internal carotid artery patent bilaterally without stenosis. Anterior and middle cerebral arteries patent bilaterally without stenosis. Negative for aneurysm IMPRESSION: Negative Electronically Signed   By: Franchot Gallo M.D.   On: 10/01/2018 18:32   Ct Head Code Stroke Wo Contrast  Result Date: 10/01/2018 CLINICAL DATA:  Code stroke.  Numbness tingling left arm today EXAM: CT HEAD WITHOUT CONTRAST TECHNIQUE: Contiguous axial images were obtained from the base of the skull through the vertex without intravenous contrast. COMPARISON:  None. FINDINGS: Brain: Ventricle size normal.  Negative for hemorrhage or mass. Small chronic infarct left superior cerebellum. Additional small hypodensities left cerebellum consistent with infarct of indeterminate age. Vascular: Negative for hyperdense vessel Skull: Negative Sinuses/Orbits: Negative Other: None ASPECTS ([place] Stroke Program Early CT Score) - Ganglionic level infarction (caudate, lentiform nuclei, internal capsule, insula, M1-M3 cortex): 7 - Supraganglionic infarction (M4-M6 cortex): 3 Total score (0-10 with 10 being normal): 10 IMPRESSION: 1. No acute intracranial abnormality 2. Small chronic appearing infarct left superior cerebellum. Additional small areas of infarct in the left cerebellum of indeterminate age. 3. ASPECTS is 10 4. These results were called by telephone at the time of interpretation on 10/01/2018 at 5:30 pm to Dr. Francine Graven , who verbally acknowledged these results. Electronically Signed   By: Franchot Gallo M.D.   On: 10/01/2018 17:31    Pending Labs [employer] (From  admission, onward)    Start     Ordered   10/01/18 1711  Urine rapid drug screen (hosp performed)not at [place],   STAT     10/01/18 1711   10/01/18 1711  Urinalysis,  Routine w reflex microscopic  ONCE - STAT,   STAT     10/01/18 1711          Vitals/Pain Today's Vitals   10/01/18 1845 10/01/18 1849 10/01/18 1906 10/01/18 1915  BP: 125/74  (!) 144/65 140/68  Pulse: 81  84 80  Resp: 18  (!) 23 17  Temp:  98.1 F (36.7 C)    TempSrc:  Oral    SpO2: 100%  99% 98%  Weight:      Height:      PainSc:        Isolation Precautions No active isolations  Medications Medications - No data to display  Mobility walks     Focused Assessments    R Recommendations: See Admitting Provider Note  Report given to:   Additional Notes:

## 2018-10-01 NOTE — H&P (Signed)
[facility] History and Physical  Kelly Torres JQZ:[id number] DOB: 1975/08/09 DOA: 10/01/2018  Referring physician: Dr Thurnell Garbe PCP: System, Pcp Not In   Chief Complaint: L arm weakness and numbness  HPI: Kelly Torres is a 43 y.o. female with hx of DVT, CVA 2015, DJD, anemia, back pain w/ hx of back surgeries x 2 in 2013-14, presented to ED today for episode of numbness/tingling and weakness of L arm since awakening this morning. Has been off her Xarelto for about a month due to insurance issues. Seen by Telehealth MD, symptoms have resolved, TPA not given.  NIHSS was 0. Recommendations were to activate the CVA admission/ order set and admit to neuro/ stroke floor, do neurochecks, bedside swallow, and give ASA if no contraindication. We are asked to see for admission.   Patient states she had L arm / hand numbness mostly and mild weakness around 2pm today at work.  Feeling better now.  Had DVT R leg in 2018 and has been on Xarelto since, followed by MD in [place].  Pt ran out recently due to insurance issues, about 1 mo ago.    Pt lives in [place], [place] w/ her husband.  No etoh/ tobacco.  No hx HTN or DM2.     EChart:  Admitted 01/2012 for R L4-5 discectomy done for herniated disc w/ lumbago and radiculopathy/ spinal stenosis.   Admitted Jan 2014 for redo R L4-5 discectomy , mild post op fever, w/u neg, dc'd   ROS  denies CP  no joint pain   no HA  no blurry vision  no rash  no diarrhea  no nausea/ vomiting  no dysuria  no difficulty voiding  no change in urine color    Past Medical History  Past Medical History:  Diagnosis Date  . Anemia   . Arthritis    back  . Chronic headaches   . Constipation - functional   . DVT (deep venous thrombosis) ([place])   . GERD (gastroesophageal reflux disease)   . Hernia, abdominal   . Hx of migraines    last 08/2011  . Hypercholesterolemia   . Late effects of cerebrovascular disease 2016  . Low back pain   . Lumbar herniated  disc    pain radiates down right leg  . Menometrorrhagia   . Sciatica of right side   . Spinal headache   . Stroke [facility]) 2015   Past Surgical History  Past Surgical History:  Procedure Laterality Date  . ANTERIOR CRUCIATE LIGAMENT REPAIR  2001   right knee, Dr. Aline Brochure, [facility]  . CESAREAN SECTION  2002, 2005   [place]  . ENDOMETRIAL ABLATION    . LUMBAR LAMINECTOMY/DECOMPRESSION MICRODISCECTOMY  01/10/2012   Procedure: LUMBAR LAMINECTOMY/DECOMPRESSION MICRODISCECTOMY 1 LEVEL;  Surgeon: Ophelia Charter, MD;  Location: [place] NEURO ORS;  Service: Neurosurgery;  Laterality: Right;  RIGHT Lumbar Four-Five Diskectomy  . LUMBAR LAMINECTOMY/DECOMPRESSION MICRODISCECTOMY  06/18/2012   Procedure: LUMBAR LAMINECTOMY/DECOMPRESSION MICRODISCECTOMY 1 LEVEL;  Surgeon: Ophelia Charter, MD;  Location: [place] NEURO ORS;  Service: Neurosurgery;  Laterality: Right;  Redo Right Lumbar Four-Five Diskectomy  . TONSILLECTOMY  age 19   Family History No family history on file. Social History  reports that she has never smoked. She has never used smokeless tobacco. She reports that she does not drink alcohol or use drugs. Allergies  Allergies  Allergen Reactions  . Latex Rash    Allergic only to condoms, no problems with elastic in clothes, gloves etc  Home medications Prior to Admission medications   Medication Sig Start Date End Date Taking? Authorizing Provider  acetaminophen (TYLENOL) 500 MG tablet Take 1,000 mg by mouth every 6 (six) hours as needed.   Yes [provider]  cyclobenzaprine (FLEXERIL) 10 MG tablet Take 1 tablet by mouth at bedtime as needed. 01/20/18  Yes [provider]  diclofenac sodium (VOLTAREN) 1 % GEL Apply 2 g topically 2 (two) times daily.   Yes [provider]  omeprazole (PRILOSEC) 40 MG capsule Take 40 mg by mouth daily.     Yes [provider]  pantoprazole (PROTONIX) 40 MG tablet Take 1 tablet by mouth daily as needed.  04/30/18  Yes [provider]  XARELTO 20 MG TABS tablet Take 1 tablet by mouth daily. 06/25/18   [provider]   Liver Function Tests Recent Labs  Lab 10/01/18 1729  AST 22  ALT 18  ALKPHOS 44  BILITOT 0.4  PROT 7.3  ALBUMIN 3.7   No results for input(s): LIPASE, AMYLASE in the last 168 hours. CBC Recent Labs  Lab 10/01/18 1729  WBC 6.9  NEUTROABS 5.4  HGB 11.8*  HCT 37.5  MCV 83.0  PLT 785   Basic Metabolic Panel Recent Labs  Lab 10/01/18 1729  NA 136  K 3.2*  CL 106  CO2 23  GLUCOSE 102*  BUN 17  CREATININE 1.05*  CALCIUM 8.3*     Vitals:   10/01/18 1730 10/01/18 1834 10/01/18 1845 10/01/18 1849  BP: 138/73 137/71 125/74   Pulse:  82 81   Resp: 18 18 18    Temp:    98.1 F (36.7 C)  TempSrc:    Oral  SpO2:  100% 100%   Weight:      Height:       Exam: Gen alert markedly obese, no distress, calm No rash, cyanosis or gangrene Sclera anicteric, throat clear  No jvd or bruits Chest clear bilat to bases no rales or wheezing RRR no MRG Abd soft ntnd no mass or ascites +bs GU defer MS no joint effusions or deformity Ext no LE or UE edema / no wounds or ulcers; some skin discoloration R lower leg anterior which is chronic Neuro is alert, Ox 3 , nf NIHHS is 0 obtained now     Home meds:  - omeprazole 40 qd  - pantoprazole 40 qd  - prn's  - xarelto 20 qd (out of this for a month)    Na 136  K 3.2  CO2 23  BUN 17  Cr 1.05  Alb 3.7  LFT"s ok    WBC 6k  Hb 11.8   plt wnl  EKG (independ reviewed) > Sinus rhythm. No old tracing to compare CT head > IMPRESSION: 1. No acute intracranial abnormality  2. Small chronic appearing infarct left superior cerebellum. Additional small areas of infarct in the left cerebellum of indeterminate age.  3. ASPECTS is 10  MRI brain > IMPRESSION: Punctate acute infarct in the left frontal cortex. No other acute infarct. Chronic infarcts in the left cerebellum.      Assessment: 1. L arm weakness/ numbness - seen by  Telehealth > admit, CVA/ TIA work-up, start aspirin 325 mg /d.  MRI showing small L frontal CVA, not sure significance w/ L sided symptoms.  Will consult neuro for assistance in am.  2. H/o RLE DVT - resume Xarelto, has been out of medication about a month due to insurance issue 3. Obesity  4. Hx lumbar DDD - sp discectomy x 2 in 2013-14    Plan - as above   DVT prophylaxis: Xarelto Family communication: none here Code status: full Admit status: OBV Bed type: Telemetry     Rob Doctor, hospital / [facility] [phone] 10/01/2018, 6:57 PM  If 7PM-7AM, please contact night-coverage [email or web address] Password [facility] 10/01/2018, 6:57 PM

## 2018-10-01 NOTE — ED Provider Notes (Signed)
[facility] EMERGENCY DEPARTMENT Provider Note   CSN: [id number] Arrival date & time: 10/01/18  1702  An emergency department physician performed an initial assessment on this suspected stroke patient at 1718.  History   Chief Complaint Chief Complaint  Patient presents with   arm tingling    HPI Kelly Torres is a 43 y.o. female.        Pt was seen at 1710.  Per pt, c/o sudden onset and persistence of constant left hand paresthesias that began approximately 1400 today PTA. Pt states she was typing when her symptoms began. Pt states her hand became "numb" while she was typing, but she was able to use her hand properly. Pt is left handed. Pt states she woke up this morning "not feeling right" further described as "having a metallic taste in my mouth." Pt states she "just thought it was my reflux." Pt endorses hx of CVA and not been taking her xarelto in the past month "due to insurance." Denies any other symptoms. Denies fever, cough, known COVID+ exposure. Denies CP/palpitations, no SOB, no abd pain, no N/V/D, no visual changes, no focal motor weakness, no other tingling/numbness in extremities, no ataxia, no slurred speech, no facial droop.    Past Medical History:  Diagnosis Date   Anemia    Arthritis    back   Chronic headaches    Constipation - functional    DVT (deep venous thrombosis) (HCC)    GERD (gastroesophageal reflux disease)    Hernia, abdominal    Hx of migraines    last 08/2011   Hypercholesterolemia    Late effects of cerebrovascular disease 2016   Low back pain    Lumbar herniated disc    pain radiates down right leg   Menometrorrhagia    Sciatica of right side    Spinal headache    Stroke [facility]) 2015    Patient Active Problem List   Diagnosis Date Noted   heavy periods 11/06/2011    Past Surgical History:  Procedure Laterality Date   ANTERIOR CRUCIATE LIGAMENT REPAIR  2001   right knee, Dr. Aline Brochure, [place]   2002, 2005   [place]   ENDOMETRIAL ABLATION     LUMBAR LAMINECTOMY/DECOMPRESSION MICRODISCECTOMY  01/10/2012   Procedure: LUMBAR LAMINECTOMY/DECOMPRESSION MICRODISCECTOMY 1 LEVEL;  Surgeon: Ophelia Charter, MD;  Location: [place] [facility] ORS;  Service: Neurosurgery;  Laterality: Right;  RIGHT Lumbar Four-Five Diskectomy   LUMBAR LAMINECTOMY/DECOMPRESSION MICRODISCECTOMY  06/18/2012   Procedure: LUMBAR LAMINECTOMY/DECOMPRESSION MICRODISCECTOMY 1 LEVEL;  Surgeon: Ophelia Charter, MD;  Location: [place] NEUR[facility] ORS;  Service: Neurosurgery;  Laterality: Right;  Redo Right Lumbar Four-Five Diskectomy   TONSILLECTOMY  age 28     OB History    Gravida  4   Para  3   Term  3   Preterm      AB  1   Living  3     SAB      TAB      Ectopic      Multiple      Live Births               Home Medications    Prior to Admission medications   Medication Sig Start Date End Date Taking? Authorizing Provider  Cyanocobalamin (B-12) 1000 MCG CAPS Take 1 tablet by mouth daily.      [provider]  diazepam (VALIUM) 5 MG tablet Take 1 tablet (5 mg total) by mouth every  6 (six) hours as needed. Patient not taking: Reported on 09/16/2017 06/20/12   Newman Pies, MD  diclofenac (VOLTAREN) 25 MG EC tablet Take 25 mg by mouth 2 (two) times daily.    [provider]  diclofenac sodium (VOLTAREN) 1 % GEL Apply 2 g topically 2 (two) times daily.    [provider]  HYDROcodone-acetaminophen (NORCO/VICODIN) 5-325 MG tablet Take one-two tabs po q 4-6 hrs prn pain Patient not taking: Reported on 09/16/2017 09/13/16   Triplett, Tammy, PA-C  ibuprofen (ADVIL,MOTRIN) 800 MG tablet Take 1 tablet (800 mg total) by mouth 3 (three) times daily. 09/13/16   Triplett, Tammy, PA-C  omeprazole (PRILOSEC) 40 MG capsule Take 40 mg by mouth daily.      [provider]  oxyCODONE-acetaminophen (PERCOCET) 10-325 MG per tablet Take 1 tablet by mouth every 4 (four) hours as needed. For pain     [provider]  oxyCODONE-acetaminophen (PERCOCET) 10-325 MG per tablet Take 1 tablet by mouth every 4 (four) hours as needed for pain. Patient not taking: Reported on 09/16/2017 06/20/12   Newman Pies, MD  Rivaroxaban (XARELTO) 15 MG TABS tablet Take 15 mg by mouth 1 day or 1 dose.    [provider]    Family History No family history on file.  Social History Social History   Tobacco Use   Smoking status: Never Smoker   Smokeless tobacco: Never Used  Substance Use Topics   Alcohol use: No   Drug use: No     Allergies   Latex   Review of Systems Review of Systems ROS: Statement: All systems negative except as marked or noted in the HPI; Constitutional: Negative for fever and chills. ; ; Eyes: Negative for eye pain, redness and discharge. ; ; ENMT: Negative for ear pain, hoarseness, nasal congestion, sinus pressure and sore throat. ; ; Cardiovascular: Negative for chest pain, palpitations, diaphoresis, dyspnea and peripheral edema. ; ; Respiratory: Negative for cough, wheezing and stridor. ; ; Gastrointestinal: Negative for nausea, vomiting, diarrhea, abdominal pain, blood in stool, hematemesis, jaundice and rectal bleeding. . ; ; Genitourinary: Negative for dysuria, flank pain and hematuria. ; ; Musculoskeletal: Negative for back pain and neck pain. Negative for swelling and trauma.; ; Skin: Negative for pruritus, rash, abrasions, blisters, bruising and skin lesion.; ; Neuro: +paresthesias. Negative for headache, lightheadedness and neck stiffness. Negative for weakness, altered level of consciousness, altered mental status, extremity weakness, involuntary movement, seizure and syncope.       Physical Exam Updated Vital Signs BP (!) 142/78    Pulse 93    Resp (!) 21    Ht 6' (1.829 m)    Wt (!) 154.7 kg    SpO2 98%    BMI 46.25 kg/m   Physical Exam 1715: Physical examination:  Nursing notes reviewed; Vital signs and O2 SAT reviewed;  Constitutional:  Well developed, Well nourished, Well hydrated, In no acute distress; Head:  Normocephalic, atraumatic; Eyes: EOMI, PERRL, No scleral icterus; ENMT: Mouth and pharynx normal, Mucous membranes moist; Neck: Supple, Full range of motion, No lymphadenopathy; Cardiovascular: Regular rate and rhythm, No gallop; Respiratory: Breath sounds clear & equal bilaterally, No wheezes.  Speaking full sentences with ease, Normal respiratory effort/excursion; Chest: Nontender, Movement normal; Abdomen: Soft, Nontender, Nondistended, Normal bowel sounds; Genitourinary: No CVA tenderness; Extremities: Peripheral pulses normal, No tenderness, No edema, No calf edema or asymmetry.; Neuro: AA&Ox3, Major CN grossly intact. Speech clear.  No facial droop. Gips equal.  Distal NMS intact  with left hand having equal sensation and decreased strength in the distribution of the median nerve, radial, and ulnar nerve function compared to opposite side.  Strong radial pulses. Negative Tinel's sign left wrist. Strength 5/5 equal bilat UE's and LE's. No gross sensory deficits.  Normal cerebellar testing bilat UE's (finger-nose) and LE's (heel-shin)..; Skin: Color normal, Warm, Dry.   ED Treatments / Results  Labs (all labs ordered are listed, but only abnormal results are displayed)   EKG EKG Interpretation  Date/Time:  Wednesday October 01 2018 17:15:33 EDT Ventricular Rate:  93 PR Interval:    QRS Duration: 95 QT Interval:  356 QTC Calculation: 443 R Axis:   89 Text Interpretation:  Sinus rhythm No old tracing to compare Confirmed by Francine Graven [phone]) on 10/01/2018 5:33:05 PM   Radiology   Procedures Procedures (including critical care time)  Medications Ordered in ED Medications - No data to display   Initial Impression / Assessment and Plan / ED Course  I have reviewed the triage vital signs and the nursing notes.  Pertinent labs & imaging results that were available during my care of the patient were reviewed  by me and considered in my medical decision making (see chart for details).     MDM Reviewed: previous chart, nursing note and vitals Reviewed previous: labs and ECG Interpretation: labs, ECG, CT scan and MRI Total time providing critical care: 30-74 minutes. This excludes time spent performing separately reportable procedures and services. Consults: neurology and admitting MD    CRITICAL CARE Performed by: Francine Graven Total critical care time: 35 minutes Critical care time was exclusive of separately billable procedures and treating other patients. Critical care was necessary to treat or prevent imminent or life-threatening deterioration. Critical care was time spent personally by me on the following activities: development of treatment plan with patient and/or surrogate as well as nursing, discussions with consultants, evaluation of patient's response to treatment, examination of patient, obtaining history from patient or surrogate, ordering and performing treatments and interventions, ordering and review of laboratory studies, ordering and review of radiographic studies, pulse oximetry and re-evaluation of patient's condition.   Results for orders placed or performed during the hospital encounter of 10/01/18  Ethanol  Result Value Ref Range   Alcohol, Ethyl (B) <10 <10 mg/dL  Protime-INR  Result Value Ref Range   Prothrombin Time 12.8 11.4 - 15.2 seconds   INR 1.0 0.8 - 1.2  APTT  Result Value Ref Range   aPTT 26 24 - 36 seconds  CBC  Result Value Ref Range   WBC 6.9 4.0 - 10.5 K/uL   RBC 4.52 3.87 - 5.11 MIL/uL   Hemoglobin 11.8 (L) 12.0 - 15.0 g/dL   HCT 37.5 36.0 - 46.0 %   MCV 83.0 80.0 - 100.0 fL   MCH 26.1 26.0 - 34.0 pg   MCHC 31.5 30.0 - 36.0 g/dL   RDW 15.4 11.5 - 15.5 %   Platelets 208 150 - 400 K/uL   nRBC 0.0 0.0 - 0.2 %  Differential  Result Value Ref Range   Neutrophils Relative % 78 %   Neutro Abs 5.4 1.7 - 7.7 K/uL   Lymphocytes Relative 13 %    Lymphs Abs 0.9 0.7 - 4.0 K/uL   Monocytes Relative 7 %   Monocytes Absolute 0.5 0.1 - 1.0 K/uL   Eosinophils Relative 1 %   Eosinophils Absolute 0.1 0.0 - 0.5 K/uL   Basophils Relative 1 %   Basophils Absolute 0.1  0.0 - 0.1 K/uL   Immature Granulocytes 0 %   Abs Immature Granulocytes 0.02 0.00 - 0.07 K/uL  Comprehensive metabolic panel  Result Value Ref Range   Sodium 136 135 - 145 mmol/L   Potassium 3.2 (L) 3.5 - 5.1 mmol/L   Chloride 106 98 - 111 mmol/L   CO2 23 22 - 32 mmol/L   Glucose, Bld 102 (H) 70 - 99 mg/dL   BUN 17 6 - 20 mg/dL   Creatinine, Ser 1.05 (H) 0.44 - 1.00 mg/dL   Calcium 8.3 (L) 8.9 - 10.3 mg/dL   Total Protein 7.3 6.5 - 8.1 g/dL   Albumin 3.7 3.5 - 5.0 g/dL   AST 22 15 - 41 U/L   ALT 18 0 - 44 U/L   Alkaline Phosphatase 44 38 - 126 U/L   Total Bilirubin 0.4 0.3 - 1.2 mg/dL   GFR calc non Af Amer >60 >60 mL/min   GFR calc Af Amer >60 >60 mL/min   Anion gap 7 5 - 15  Troponin I - ONCE - STAT  Result Value Ref Range   Troponin I 0.04 (HH) <0.03 ng/mL  POC urine preg, ED  Result Value Ref Range   Preg Test, Ur NEGATIVE NEGATIVE  CBG monitoring, ED  Result Value Ref Range   Glucose-Capillary 110 (H) 70 - 99 mg/dL   Ct Head Code Stroke Wo Contrast Result Date: 10/01/2018 CLINICAL DATA:  Code stroke.  Numbness tingling left arm today EXAM: CT HEAD WITHOUT CONTRAST TECHNIQUE: Contiguous axial images were obtained from the base of the skull through the vertex without intravenous contrast. COMPARISON:  None. FINDINGS: Brain: Ventricle size normal.  Negative for hemorrhage or mass. Small chronic infarct left superior cerebellum. Additional small hypodensities left cerebellum consistent with infarct of indeterminate age. Vascular: Negative for hyperdense vessel Skull: Negative Sinuses/Orbits: Negative Other: None ASPECTS ([place] Stroke Program Early CT Score) - Ganglionic level infarction (caudate, lentiform nuclei, internal capsule, insula, M1-M3 cortex): 7 -  Supraganglionic infarction (M4-M6 cortex): 3 Total score (0-10 with 10 being normal): 10 IMPRESSION: 1. No acute intracranial abnormality 2. Small chronic appearing infarct left superior cerebellum. Additional small areas of infarct in the left cerebellum of indeterminate age. 3. ASPECTS is 10 4. These results were called by telephone at the time of interpretation on 10/01/2018 at 5:30 pm to Dr. Francine Graven , who verbally acknowledged these results. Electronically Signed   By: Franchot Gallo M.D.   On: 10/01/2018 17:31   Mr Brain Wo Contrast (neuro Protocol) Result Date: 10/01/2018 CLINICAL DATA:  Focal neurologic deficit. Left arm numbness and tingling today EXAM: MRI HEAD WITHOUT CONTRAST TECHNIQUE: Multiplanar, multiecho pulse sequences of the brain and surrounding structures were obtained without intravenous contrast. COMPARISON:  CT head today FINDINGS: Brain: Punctate area of restricted diffusion left frontal cortex compatible with acute infarct. This could affect speech. No other acute infarct. Chronic infarct left cerebellum as noted on CT. Small chronic infarct in the left parietal lobe. Ventricle size normal. No hemorrhage or mass lesion Vascular: Normal arterial flow voids. Skull and upper cervical spine: Negative Sinuses/Orbits: Mild mucosal edema paranasal sinuses.  Normal orbit Other: None IMPRESSION: Punctate acute infarct in the left frontal cortex. No other acute infarct. Chronic infarcts in the left cerebellum. Electronically Signed   By: Franchot Gallo M.D.   On: 10/01/2018 18:28   Mr Jodene Nam Head (cerebral Arteries) Result Date: 10/01/2018 CLINICAL DATA:  Stroke symptoms.  Numbness tingling left arm EXAM: MRA HEAD WITHOUT CONTRAST  TECHNIQUE: Angiographic images of the Circle of Willis were obtained using MRA technique without intravenous contrast. COMPARISON:  MRI head today FINDINGS: Both vertebral arteries patent to the basilar without significant stenosis. Right PICA patent. Left PICA  not visualized. Basilar widely patent. Superior cerebellar and posterior cerebral arteries patent bilaterally. Patent posterior communicating artery bilaterally. Internal carotid artery patent bilaterally without stenosis. Anterior and middle cerebral arteries patent bilaterally without stenosis. Negative for aneurysm IMPRESSION: Negative Electronically Signed   By: Franchot Gallo M.D.   On: 10/01/2018 18:32    1535:  Code Stroke called on pt's arrival. Tele Neuro Dr. Gaye Pollack has evaluated pt in the ED: states NIH 0 now, symptoms have resolved, pt not TPA candidate, given pt's hx of previous CVA and being off her xarelto, recommends admit for TIA/CVA workup.   1920:  MRI with punctate acute infarct. T/C returned from [facility] Dr. Jonnie Finner, case discussed, including:  HPI, pertinent PM/SHx, VS/PE, dx testing, ED course and treatment:  Agreeable to admit.       Final Clinical Impressions(s) / ED Diagnoses   Final diagnoses:  None    ED Discharge Orders    None       Francine Graven, DO 10/06/18 1843

## 2018-10-01 NOTE — ED Triage Notes (Signed)
Pt reports some numbness and tingling in her left arm today since waking this am. States she has a history of CVA and DVT and has been off her Jennye Moccasin for a month due to insurance was concerned

## 2018-10-01 NOTE — ED Notes (Signed)
Date and time results received: 10/01/18 1844 (use smartphrase ".now" to insert current time)  Test: troponin Critical Value: 0.04  Name of Provider Notified: [email or web address]  Orders Received? Or Actions Taken?:

## 2018-10-01 NOTE — Consult Note (Signed)
[facility] Tele[facility]urology Consult Services   Date of Service:   10/01/2018 17:20:34  Impression:     .  Rule Out Acute Ischemic Stroke     .  Transient Ischemic Attack  Comments/Sign-Out: 43 y/o woman with h/o stroke presents with transient left arm weakness and pain. Symptoms resolved. NIHSS 0. Patient to be admitted for TIA/Stroke work-up.  Metrics: Last Known Well: 10/01/2018 14:00:00 [facility] Notification Time: 10/01/2018 17:20:34 Arrival Time: 10/01/2018 17:03:00 Stamp Time: 10/01/2018 17:20:34 Time First Login Attempt: 10/01/2018 17:24:42 Video Start Time: 10/01/2018 17:24:42  Symptoms: left shoulder/arm pain and weakness NIHSS Start Assessment Time: 10/01/2018 17:25:04 Patient is not a candidate for tPA. Patient was not deemed candidate for tPA thrombolytics because of Resolved symptoms (no residual disabling symptoms). Video End Time: 10/01/2018 17:36:09  CT head was reviewed.  Clinical Presentation is not Suggestive of Large Vessel Occlusive Disease  ED Physician notified of diagnostic impression and management plan on 10/01/2018 17:38:00  Our recommendations are outlined below.  Recommendations:     .  Activate Stroke Protocol Admission/Order Set     .  Stroke/Telemetry Floor     .  Neuro Checks     .  Bedside Swallow Eval     .  DVT Prophylaxis     .  IV Fluids, Normal Saline     .  Head of Bed Below 30 Degrees     .  Euglycemia and Avoid Hyperthermia (PRN Acetaminophen)     .  ASA if no contraindication   Sign Out:     .  Discussed with Emergency Department Provider    ------------------------------------------------------------------------------  History of Present Illness: Patient is a 43 year old Female.  Patient was brought by EMS for symptoms of left shoulder/arm pain and weakness  43 y/o woman with h/o DVT, stroke, hyperlipidemia who left arm pain and weakness. Emergent telestroke consult requested. CT head  reviewed and case discussed with ED staff. Off Xarelto for over a month. NIHSS 0. Patient states "I feel fine." Patient states she is back to her baseline. I discussed the risk, benefits and alternatives to IV TPA and the patient declined IV TPA since she is back to normal.  CT head was reviewed.   Examination: 1A: Level of Consciousness - Alert; keenly responsive + 0 1B: Ask Month and Age - Both Questions Right + 0 1C: Blink Eyes & Squeeze Hands - Performs Both Tasks + 0 2: Test Horizontal Extraocular Movements - Normal + 0 3: Test Visual Fields - No Visual Loss + 0 4: Test Facial Palsy (Use Grimace if Obtunded) - Normal symmetry + 0 5A: Test Left Arm Motor Drift - No Drift for 10 Seconds + 0 5B: Test Right Arm Motor Drift - No Drift for 10 Seconds + 0 6A: Test Left Leg Motor Drift - No Drift for 5 Seconds + 0 6B: Test Right Leg Motor Drift - No Drift for 5 Seconds + 0 7: Test Limb Ataxia (FNF/Heel-Shin) - No Ataxia + 0 8: Test Sensation - Normal; No sensory loss + 0 9: Test Language/Aphasia - Normal; No aphasia + 0 10: Test Dysarthria - Normal + 0 11: Test Extinction/Inattention - No abnormality + 0  NIHSS Score: 0  Patient was informed the Neurology Consult would happen via TeleHealth consult by way of interactive audio and video telecommunications and consented to receiving care in this manner.  Due to the immediate potential for life-threatening deterioration due to underlying acute neurologic illness, I spent 15  minutes providing critical care. This time includes time for face to face visit via telemedicine, review of medical records, imaging studies and discussion of findings with providers, the patient and/or family.   Dr Meryl Crutch   [facility] [phone]   Case [id number]

## 2018-10-02 ENCOUNTER — Observation Stay (HOSPITAL_COMMUNITY): Payer: Medicaid Other

## 2018-10-02 ENCOUNTER — Observation Stay (HOSPITAL_BASED_OUTPATIENT_CLINIC_OR_DEPARTMENT_OTHER): Payer: Medicaid Other

## 2018-10-02 DIAGNOSIS — G4733 Obstructive sleep apnea (adult) (pediatric): Secondary | ICD-10-CM | POA: Diagnosis present

## 2018-10-02 DIAGNOSIS — D649 Anemia, unspecified: Secondary | ICD-10-CM | POA: Diagnosis present

## 2018-10-02 DIAGNOSIS — I639 Cerebral infarction, unspecified: Secondary | ICD-10-CM | POA: Diagnosis present

## 2018-10-02 DIAGNOSIS — K219 Gastro-esophageal reflux disease without esophagitis: Secondary | ICD-10-CM | POA: Diagnosis present

## 2018-10-02 DIAGNOSIS — G8324 Monoplegia of upper limb affecting left nondominant side: Secondary | ICD-10-CM | POA: Diagnosis present

## 2018-10-02 DIAGNOSIS — G40909 Epilepsy, unspecified, not intractable, without status epilepticus: Secondary | ICD-10-CM | POA: Diagnosis present

## 2018-10-02 DIAGNOSIS — I351 Nonrheumatic aortic (valve) insufficiency: Secondary | ICD-10-CM | POA: Diagnosis present

## 2018-10-02 DIAGNOSIS — Z6841 Body Mass Index (BMI) 40.0 and over, adult: Secondary | ICD-10-CM | POA: Diagnosis not present

## 2018-10-02 DIAGNOSIS — G459 Transient cerebral ischemic attack, unspecified: Secondary | ICD-10-CM

## 2018-10-02 DIAGNOSIS — E785 Hyperlipidemia, unspecified: Secondary | ICD-10-CM | POA: Diagnosis present

## 2018-10-02 DIAGNOSIS — Z8673 Personal history of transient ischemic attack (TIA), and cerebral infarction without residual deficits: Secondary | ICD-10-CM | POA: Diagnosis not present

## 2018-10-02 DIAGNOSIS — R29700 NIHSS score 0: Secondary | ICD-10-CM | POA: Diagnosis present

## 2018-10-02 DIAGNOSIS — Z86718 Personal history of other venous thrombosis and embolism: Secondary | ICD-10-CM | POA: Diagnosis not present

## 2018-10-02 DIAGNOSIS — M1990 Unspecified osteoarthritis, unspecified site: Secondary | ICD-10-CM | POA: Diagnosis present

## 2018-10-02 DIAGNOSIS — Z91040 Latex allergy status: Secondary | ICD-10-CM | POA: Diagnosis not present

## 2018-10-02 DIAGNOSIS — D6859 Other primary thrombophilia: Secondary | ICD-10-CM | POA: Diagnosis not present

## 2018-10-02 DIAGNOSIS — Z91120 Patient's intentional underdosing of medication regimen due to financial hardship: Secondary | ICD-10-CM | POA: Diagnosis not present

## 2018-10-02 DIAGNOSIS — R200 Anesthesia of skin: Secondary | ICD-10-CM | POA: Diagnosis not present

## 2018-10-02 DIAGNOSIS — E7800 Pure hypercholesterolemia, unspecified: Secondary | ICD-10-CM | POA: Diagnosis present

## 2018-10-02 DIAGNOSIS — R29898 Other symptoms and signs involving the musculoskeletal system: Secondary | ICD-10-CM | POA: Diagnosis not present

## 2018-10-02 LAB — LIPID PANEL
Cholesterol: 169 mg/dL (ref 0–200)
HDL: 40 mg/dL — ABNORMAL LOW (ref >40)
LDL Cholesterol: 112 mg/dL — ABNORMAL HIGH (ref 0–99)
Total CHOL/HDL Ratio: 4.2 RATIO
Triglycerides: 87 mg/dL (ref <150)
VLDL: 17 mg/dL (ref 0–40)

## 2018-10-02 LAB — ECHOCARDIOGRAM LIMITED BUBBLE STUDY
Height: 72 in
Weight: 5456 oz

## 2018-10-02 LAB — VITAMIN B12: Vitamin B-12: 399 pg/mL (ref 180–914)

## 2018-10-02 LAB — HEMOGLOBIN A1C
Hgb A1c MFr Bld: 4.9 % (ref 4.8–5.6)
Mean Plasma Glucose: 93.93 mg/dL

## 2018-10-02 LAB — TSH: TSH: 2.631 u[IU]/mL (ref 0.350–4.500)

## 2018-10-02 MED ORDER — SODIUM CHLORIDE 0.9 % IV SOLN
INTRAVENOUS | Status: AC
  Administered 2018-10-02 (×2): via INTRAVENOUS

## 2018-10-02 MED ORDER — ATORVASTATIN CALCIUM 40 MG PO TABS
40.0000 mg | ORAL_TABLET | Freq: Every day | ORAL | Status: DC
  Administered 2018-10-02: 17:00:00 40 mg via ORAL
  Filled 2018-10-02: qty 1

## 2018-10-02 NOTE — Progress Notes (Signed)
*  PRELIMINARY RESULTS* Echocardiogram 2D Echocardiogram LIMITED WITH BUBBLE has been performed.  Leavy Cella 10/02/2018, 9:53 AM

## 2018-10-02 NOTE — Progress Notes (Signed)
Faxed request for patient records to Dr. On patient's file.

## 2018-10-02 NOTE — Progress Notes (Signed)
PROGRESS NOTE    Kelly Torres  JQB:[id number]  DOB: 10/13/1975  DOA: 10/01/2018 PCP: Meliton Rattan, MD   Brief Admission Hx: 43 year old female with history of recurrent DVT, CVA in 2015, anemia, chronic back pain, DJD presented with left arm weakness and numbness and discovered by MRI to have an acute CVA.  MDM/Assessment & Plan:   1. Acute CVA- patient is being admitted for further work-up she has been started on aspirin for antiplatelet therapy.  Her MRI reveals a small left frontal CVA.  Her LDL is suboptimal and she has been started on atorvastatin 40 mg daily.  Neurology consult has been requested.  The patient is on telemetry monitoring.  The patient had been fully anticoagulated with Xarelto but upon further questioning she admits that she has been out of the medication for approximately 1 month due to losing insurance benefits but now reports that she has Medicaid benefits and should be able to take the medicine again. 2. History of recurrent DVT-she has been on Xarelto for full anticoagulation but unfortunately has been out of medication for at least 1 month due to loss of insurance coverage.  She reports that she has Medicaid benefits now and is willing to take the medicine.  She is restarted on Xarelto at this time. 3. Morbid obesity-patient diet and exercise counseling at bedside. 4. DDD with chronic symptoms-status post discectomy at x2.  DVT prophylaxis: Xarelto Code Status: Full Family Communication: Patient fully updated at bedside Disposition Plan: Home tomorrow if stable   Consultants:  Neurology  Procedures:  MRI brain  Antimicrobials:  N/A  Subjective: Patient reports that she feels weak but otherwise her weakness in the extremities seems to be improving.  She has no difficulty with speaking or swallowing.  Objective: Vitals:   10/02/18 0800 10/02/18 1000 10/02/18 1220 10/02/18 1316  BP: 140/81 137/86 135/87 131/84  Pulse: (!) 59 (!)  58 (!) 59 61  Resp: 18 19 19 19   Temp: 98.7 F (37.1 C) 97.8 F (36.6 C) 97.8 F (36.6 C) 97.8 F (36.6 C)  TempSrc: Oral Oral Oral Oral  SpO2: 94% 96% 100% 100%  Weight:      Height:        Intake/Output Summary (Last 24 hours) at 10/02/2018 1404 Last data filed at 10/02/2018 1300 Gross per 24 hour  Intake 840 ml  Output --  Net 840 ml   Filed Weights   10/01/18 1708  Weight: (!) 154.7 kg     REVIEW OF SYSTEMS  As per history otherwise all reviewed and reported negative  Exam:  General exam: Awake, alert, no apparent distress, cooperative and pleasant, lying in bed. Respiratory system: Clear. No increased work of breathing. Cardiovascular system: S1 & S2 heard. No JVD, murmurs, gallops, clicks or pedal edema. Gastrointestinal system: Abdomen is nondistended, soft and nontender. Normal bowel sounds heard. Central nervous system: Alert and oriented. No focal neurological deficits. Extremities: Left upper extremity strength 4/5 left lower extremity 4/5 no other neuro deficits.  Data Reviewed: Basic Metabolic Panel: Recent Labs  Lab 10/01/18 1729  NA 136  K 3.2*  CL 106  CO2 23  GLUCOSE 102*  BUN 17  CREATININE 1.05*  CALCIUM 8.3*   Liver Function Tests: Recent Labs  Lab 10/01/18 1729  AST 22  ALT 18  ALKPHOS 44  BILITOT 0.4  PROT 7.3  ALBUMIN 3.7   No results for input(s): LIPASE, AMYLASE in the last 168 hours. No results for input(s): AMMONIA  in the last 168 hours. CBC: Recent Labs  Lab 10/01/18 1729  WBC 6.9  NEUTROABS 5.4  HGB 11.8*  HCT 37.5  MCV 83.0  PLT 208   Cardiac Enzymes: Recent Labs  Lab 10/01/18 1729  TROPONINI 0.04*   CBG (last 3)  Recent Labs    10/01/18 1727  GLUCAP 110*   No results found for this or any previous visit (from the past 240 hour(s)).   Studies: Dg Chest 2 View  Result Date: 10/01/2018 CLINICAL DATA:  TIA EXAM: CHEST - 2 VIEW COMPARISON:  08/05/2017 FINDINGS: Cardiac enlargement with normal  vascularity. Negative for heart failure. Negative for infiltrate effusion or edema. Lungs are clear. IMPRESSION: No active cardiopulmonary disease. Electronically Signed   By: Franchot Gallo M.D.   On: 10/01/2018 20:12   Mr Brain Wo Contrast (neuro Protocol)  Result Date: 10/01/2018 CLINICAL DATA:  Focal neurologic deficit. Left arm numbness and tingling today EXAM: MRI HEAD WITHOUT CONTRAST TECHNIQUE: Multiplanar, multiecho pulse sequences of the brain and surrounding structures were obtained without intravenous contrast. COMPARISON:  CT head today FINDINGS: Brain: Punctate area of restricted diffusion left frontal cortex compatible with acute infarct. This could affect speech. No other acute infarct. Chronic infarct left cerebellum as noted on CT. Small chronic infarct in the left parietal lobe. Ventricle size normal. No hemorrhage or mass lesion Vascular: Normal arterial flow voids. Skull and upper cervical spine: Negative Sinuses/Orbits: Mild mucosal edema paranasal sinuses.  Normal orbit Other: None IMPRESSION: Punctate acute infarct in the left frontal cortex. No other acute infarct. Chronic infarcts in the left cerebellum. Electronically Signed   By: Franchot Gallo M.D.   On: 10/01/2018 18:28   US Carotid Bilateral (at [facility] And [facility] Only)  Result Date: 10/02/2018 CLINICAL DATA:  Acute small left frontal cerebral infarct by MRI. EXAM: BILATERAL CAROTID DUPLEX ULTRASOUND TECHNIQUE: Pearline Cables scale imaging, color Doppler and duplex ultrasound were performed of bilateral carotid and vertebral arteries in the neck. COMPARISON:  None. FINDINGS: Criteria: Quantification of carotid stenosis is based on velocity parameters that correlate the residual internal carotid diameter with NASCET-based stenosis levels, using the diameter of the distal internal carotid lumen as the denominator for stenosis measurement. The following velocity measurements were obtained: RIGHT ICA:  97/37 cm/sec CCA:  94/76 cm/sec SYSTOLIC  ICA/CCA RATIO:  1.1 ECA:  54 cm/sec LEFT ICA:  94/38 cm/sec CCA:  54/65 cm/sec SYSTOLIC ICA/CCA RATIO:  1.2 ECA:  42 cm/sec RIGHT CAROTID ARTERY: No focal plaque is identified. There is no evidence right-sided carotid stenosis. RIGHT VERTEBRAL ARTERY: Antegrade flow with normal waveform and velocity. LEFT CAROTID ARTERY: No focal plaque is identified. There is no evidence of left-sided carotid stenosis. LEFT VERTEBRAL ARTERY: Antegrade flow with normal waveform and velocity. IMPRESSION: Normal carotid duplex ultrasound demonstrating no evidence of carotid plaque or stenosis bilaterally. Electronically Signed   By: Aletta Edouard M.D.   On: 10/02/2018 12:14   Mr Jodene Nam Head (cerebral Arteries)  Result Date: 10/01/2018 CLINICAL DATA:  Stroke symptoms.  Numbness tingling left arm EXAM: MRA HEAD WITHOUT CONTRAST TECHNIQUE: Angiographic images of the Circle of Willis were obtained using MRA technique without intravenous contrast. COMPARISON:  MRI head today FINDINGS: Both vertebral arteries patent to the basilar without significant stenosis. Right PICA patent. Left PICA not visualized. Basilar widely patent. Superior cerebellar and posterior cerebral arteries patent bilaterally. Patent posterior communicating artery bilaterally. Internal carotid artery patent bilaterally without stenosis. Anterior and middle cerebral arteries patent bilaterally without stenosis. Negative for aneurysm  IMPRESSION: Negative Electronically Signed   By: Franchot Gallo M.D.   On: 10/01/2018 18:32   Ct Head Code Stroke Wo Contrast  Result Date: 10/01/2018 CLINICAL DATA:  Code stroke.  Numbness tingling left arm today EXAM: CT HEAD WITHOUT CONTRAST TECHNIQUE: Contiguous axial images were obtained from the base of the skull through the vertex without intravenous contrast. COMPARISON:  None. FINDINGS: Brain: Ventricle size normal.  Negative for hemorrhage or mass. Small chronic infarct left superior cerebellum. Additional small hypodensities  left cerebellum consistent with infarct of indeterminate age. Vascular: Negative for hyperdense vessel Skull: Negative Sinuses/Orbits: Negative Other: None ASPECTS ([place] Stroke Program Early CT Score) - Ganglionic level infarction (caudate, lentiform nuclei, internal capsule, insula, M1-M3 cortex): 7 - Supraganglionic infarction (M4-M6 cortex): 3 Total score (0-10 with 10 being normal): 10 IMPRESSION: 1. No acute intracranial abnormality 2. Small chronic appearing infarct left superior cerebellum. Additional small areas of infarct in the left cerebellum of indeterminate age. 3. ASPECTS is 10 4. These results were called by telephone at the time of interpretation on 10/01/2018 at 5:30 pm to Dr. Francine Graven , who verbally acknowledged these results. Electronically Signed   By: Franchot Gallo M.D.   On: 10/01/2018 17:31     Scheduled Meds:  aspirin  325 mg Oral Daily   atorvastatin  40 mg Oral q1800   pantoprazole  40 mg Oral Daily   rivaroxaban  20 mg Oral Daily   Continuous Infusions:  sodium chloride 50 mL/hr at 10/02/18 1128    Active Problems:   Left arm numbness   Left arm weakness   TIA (transient ischemic attack)   Time spent:   Irwin Brakeman, MD [facility] 10/02/2018, 2:04 PM    LOS: 0 days  How to contact the [facility] Attending or Consulting provider [place] or covering provider during after hours [place], for this patient?  1. Check the care team in [facility] and look for a) attending/consulting [facility] provider listed and b) the [facility] team listed 2. Log into [email or web address] and use [place]'s universal password to access. If you do not have the password, please contact the hospital operator. 3. Locate the [facility] provider you are looking for under [facility] and page to a number that you can be directly reached. 4. If you still have difficulty reaching the provider, please page the [facility] (Director on Call) for the Hospitalists listed on amion for assistance.

## 2018-10-02 NOTE — Progress Notes (Signed)
SLP Cancellation Note  Patient Details Name: Kelly Torres MRN: [id number] DOB: [birth year]   Cancelled treatment:       Reason Eval/Treat Not Completed: SLP screened, no needs identified, will sign off   PORTER,DABNEY 10/02/2018, 9:40 AM

## 2018-10-02 NOTE — Consult Note (Signed)
[place] A. Merlene Laughter, MD     [email or web address]          Kelly Torres is an 43 y.o. female.   ASSESSMENT/PLAN: 1.  Transient weakness on the left side without clear MRI evidence of associated infarct.  Exact etiology unclear but potential etiologies includes recrudescence of previous infarct or other post lesional phenomena such as post epileptic weakness.  An EEG is recommended.  I see no reason for the patient to be on long-term anticoagulation.  This is even more important since she has not been able to afford the medication.  I recommend aspirin 325 mg.  A 30-day event monitor is recommended.  Other risk factor modification includes weight loss and evaluation for obstructive sleep apnea syndrome.  Consider retrial of a different statin medication such as pravastatin or simvastatin.  She previously could not tolerate atorvastatin.  2.  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  This is a tiny cortical-based infarct.  Recommendations as above.  3.  Likely obstructive sleep apnea syndrome.  Outpatient sleep study is recommended.  4.  Morbid obesity  5.  Previous DVT involving the right lower extremity     The patient is a 43 year old black female who presents with the acute onset of just not feeling right, slight dysarthria and weakness involving the left upper extremity and left leg.  The episode lasted for a few hours.  She tells me is that she had an event like this in 2015.  She was evaluated at [facility].  The notes are reviewed and copied below.  It appears that she presented with a NIH stroke scale of 3 at that time and received IV TPA.  She reports that she had rehab but her symptoms persisted for about 6 months before she returned to normal.  She has been normal since then.  She initially was treated with the antiplatelet agent aspirin and Lipitor.  She tells me however that she cannot take the Lipitor because it caused significant  cognitive impairment.  She developed a DVT of the left lower extremity and was placed on Xarelto.  It appears from the notes that she was on this for over a year.  She has been off the medication because she apparently lost her insurance and could not pay for the medication.  It does not appear she was on any antiplatelet agents after this medication was discontinued.  The patient does not report having any significant visual changes, chest pain, palpitation or unusual shortness of breath.  She tells me that she has had some dull headaches which have gotten worse with this event.  She has ongoing episodic shortness of breath.  She does snore.  She is never been evaluated for obstructive sleep apnea syndrome.  The review of systems otherwise negative.  The patient tells me that she did have the event monitor after her stroke in 2015 which was negative.    GENERAL: This a very pleasant morbidly obese female in no acute distress.  HEENT: She has a large stocky neck and large tongue.  ABDOMEN: soft  EXTREMITIES: No edema   BACK: Normal  SKIN: Normal by inspection.    MENTAL STATUS: Alert and oriented -including orientation to her age and the month. Speech, language and cognition are generally intact. Judgment and insight normal.   CRANIAL NERVES: Pupils are equal, round and reactive to light and accomodation; extra ocular movements are full, there is no significant nystagmus; visual fields are full; upper  and lower facial muscles are normal in strength and symmetric, there is no flattening of the nasolabial folds; tongue is midline; uvula is midline; shoulder elevation is normal.  MOTOR: Normal tone, bulk and strength; no pronator drift.  COORDINATION: Left finger to nose is normal, right finger to nose is normal, No rest tremor; no intention tremor; no postural tremor; no bradykinesia.  REFLEXES: Deep tendon reflexes are symmetrical and normal.    SENSATION: Normal to light touch,  temperature, and pain.   NIH stroke scale 0.   [facility] 2015 Sarabeth CNOBSJGGE Goris is a 43 y.o. female left handed female with relevant past medical history of chronic back pain, stroke, GERD, morbid obesity and recent hospitalization from 5/24 to 11/03/13 for symptoms of acute onset dizziness, nausea, and vomiting with left facial droop and LUE dysmetria. MRI was not obtained due to body habitus. Repeat CT after 24 hour of symptoms showed left cerebellar stroke. CTA vascular imaging showed no significant vascular stenosis. Further stroke work up revealed preserved EF, no PFO, and no evidence of afib. Risk stratification labs are listed below. Thrombophilia work up was unremarkable that included beta 2 glycoprotein, cardiolipin antibodies, lupus inhibitor, thrombophilia genotype and functional panel, lipoprotein a. Stroke risk factors include morbid obesity and migraines. she was started on aspirin 325 mg daily and lipitor 40 mg daily for stroke prevention.   Lab Results  Component Value Date  CHOL 158 11/02/2013  TRIG 122 11/02/2013  HDL 33* 11/02/2013  LDL 95 11/02/2013    Component Value Date/Time  HBA1C 4.7 11/02/2013 1042    Component Value Date/Time  TSH 1.635 11/02/2013 1042   Today, she returns for hospital follow up in outpatient neurology clinic. She presents alone ambulating without assistance. She reports some trouble with stuttering and fluency of speech, word finding difficulty, and short term memory issues that continue to improve. The facial droop, imbalance, and dizziness have resolved. She completed PT/OT/ST and reports coordination and strength of her left hand have improved. She has had 1-2 frontal pounding headaches per month, bilateral location while on her menses. Associated with nausea no vomiting and photo/phonophobia. Usually takes tylenol or excedrin migraine and goes to a dark room which is helpful. She does have aura of squiggly lines in eye.  Triggers are stress and menses. She reports increased HAs this past week but overall frequency unchanges, HAs last ~1h. She specifically denies new focal neurologic symptoms suggestive of stroke or TIA.  She is taking all meds daily and states she has only missed 2 doses of aspirin since last visit, is tolerating meds well. She has been exercising and dieting, states she has lost 12 pounds in the past 2 months. She also checks her BP, SBP ~120s-130s usually.    [facility] Course HPI: Shiya Fogelman Maring is a 43 y.o. female with past medical history significant for chronic back pain and GERD who presented initially on 11/01/2013 to [facility] with acute onset dizziness, nausea, and vomiting with left facial droop and LUE dysmetria. For full details of admission, please refer to H&P by Dr. Verlan Friends. In brief, the patient was in her usual state of health until she developed the above symptoms. The patient subsequently reported to the ED and neurology consultation was called. Upon initial exam, the patient was found to have NIHSS of 3. Head CT showed no acute intracranial abnormalities. The patient was deemed a candidate for IV tPA and received IV tPA at 1420 on 11/01/13. The patient  was admitted to the neurology stroke service for further evaluation and management.   Hospital Course: The patient was admitted to the neurology ICU. The patient was unable to undergo MRI testing due to her size. Carotid ultrasound and transcranial dopplers were obtained which showed no significant stenotic flow. Transthoracic echocardiogram showed an EF of 55% and no evidence of PFO. The patient was monitored on telemetry during admission and showed no evidence of atrial fibrillation. Risk stratification labs were obtained which showed TSH of 1.635 and Hemoglobin A1C of 4.7. Lipid profile was obtained which showed HDL-33 and LDL-95. Given aforementioned workup the patient's presenting symptoms were felt to be most  likely secondary to cerebellar infarction. Thrombophilia workup was initiated and is pending at time of discharge. The patient was started on aspirin 325 mg and atorvastatin 40 mg for future stroke prevention which should be continued upon discharge. The patient returned to her prior level of function. On 11/03/13 the patient was deemed medically stable for discharge from the hospital to home with close PCP and neurology follow up.       Blood pressure (!) 142/84, pulse (!) 53, temperature 98.1 F (36.7 C), temperature source Oral, resp. rate 18, height 6' (1.829 m), weight (!) 154.7 kg, last menstrual period 09/24/2018, SpO2 100 %.  Past Medical History:  Diagnosis Date   Anemia    Arthritis    back   Chronic headaches    Constipation - functional    DVT (deep venous thrombosis) (HCC)    GERD (gastroesophageal reflux disease)    Hernia, abdominal    Hx of migraines    last 08/2011   Hypercholesterolemia    Late effects of cerebrovascular disease 2016   Low back pain    Lumbar herniated disc    pain radiates down right leg   Menometrorrhagia    Sciatica of right side    Spinal headache    Stroke ([place]) 2015    Past Surgical History:  Procedure Laterality Date   ANTERIOR CRUCIATE LIGAMENT REPAIR  2001   right knee, Dr. Aline Brochure, [place]  2002, 2005   [place]   ENDOMETRIAL ABLATION     LUMBAR LAMINECTOMY/DECOMPRESSION MICRODISCECTOMY  01/10/2012   Procedure: LUMBAR LAMINECTOMY/DECOMPRESSION MICRODISCECTOMY 1 LEVEL;  Surgeon: Ophelia Charter, MD;  Location: [place];  Service: Neurosurgery;  Laterality: Right;  RIGHT Lumbar Four-Five Diskectomy   LUMBAR LAMINECTOMY/DECOMPRESSION MICRODISCECTOMY  06/18/2012   Procedure: LUMBAR LAMINECTOMY/DECOMPRESSION MICRODISCECTOMY 1 LEVEL;  Surgeon: Ophelia Charter, MD;  Location: [place] [facility];  Service: Neurosurgery;  Laterality: Right;  Redo Right Lumbar Four-Five Diskectomy   TONSILLECTOMY  age 48      No family history on file.  Social History:  reports that she has never smoked. She has never used smokeless tobacco. She reports that she does not drink alcohol or use drugs.  Allergies:  Allergies  Allergen Reactions   Latex Rash    Allergic only to condoms, no problems with elastic in clothes, gloves etc    Medications: Prior to Admission medications   Medication Sig Start Date End Date Taking? Authorizing Provider  acetaminophen (TYLENOL) 500 MG tablet Take 1,000 mg by mouth every 6 (six) hours as needed.   Yes [provider]  cyclobenzaprine (FLEXERIL) 10 MG tablet Take 1 tablet by mouth at bedtime as needed. 01/20/18  Yes [provider]  diclofenac sodium (VOLTAREN) 1 % GEL Apply 2 g topically 2 (two) times daily.   Yes [provider]  omeprazole (PRILOSEC) 40 MG capsule Take 40 mg by mouth daily.     Yes [provider]  pantoprazole (PROTONIX) 40 MG tablet Take 1 tablet by mouth daily as needed.  04/30/18  Yes [provider]  XARELTO 20 MG TABS tablet Take 1 tablet by mouth daily. 06/25/18   [provider]    Scheduled Meds:  aspirin  325 mg Oral Daily   atorvastatin  40 mg Oral q1800   pantoprazole  40 mg Oral Daily   rivaroxaban  20 mg Oral Daily   Continuous Infusions:  sodium chloride 50 mL/hr at 10/02/18 1635   PRN Meds:.acetaminophen **OR** acetaminophen (TYLENOL) oral liquid 160 mg/5 mL **OR** acetaminophen, cyclobenzaprine, senna-docusate     Results for orders placed or performed during the hospital encounter of 10/01/18 (from the past 48 hour(s))  Urine rapid drug screen (hosp performed)not at [facility]     Status: None   Collection Time: 10/01/18  5:11 PM  Result Value Ref Range   Opiates NONE DETECTED NONE DETECTED   Cocaine NONE DETECTED NONE DETECTED   Benzodiazepines NONE DETECTED NONE DETECTED   Amphetamines NONE DETECTED NONE DETECTED   Tetrahydrocannabinol NONE DETECTED NONE DETECTED    Barbiturates NONE DETECTED NONE DETECTED    Comment: (NOTE) DRUG SCREEN FOR MEDICAL PURPOSES ONLY.  IF CONFIRMATION IS NEEDED FOR ANY PURPOSE, NOTIFY LAB WITHIN 5 DAYS. LOWEST DETECTABLE LIMITS FOR URINE DRUG SCREEN Drug Class                     Cutoff (ng/mL) Amphetamine and metabolites    1000 Barbiturate and metabolites    200 Benzodiazepine                 128 Tricyclics and metabolites     300 Opiates and metabolites        300 Cocaine and metabolites        300 THC                            50 Performed at [facility], [address], [place], [place] [address]   Urinalysis, Routine w reflex microscopic     Status: Abnormal   Collection Time: 10/01/18  5:11 PM  Result Value Ref Range   Color, Urine STRAW (A) YELLOW   APPearance CLEAR CLEAR   Specific Gravity, Urine 1.003 (L) 1.005 - 1.030   pH 6.0 5.0 - 8.0   Glucose, UA NEGATIVE NEGATIVE mg/dL   Hgb urine dipstick NEGATIVE NEGATIVE   Bilirubin Urine NEGATIVE NEGATIVE   Ketones, ur NEGATIVE NEGATIVE mg/dL   Protein, ur NEGATIVE NEGATIVE mg/dL   Nitrite NEGATIVE NEGATIVE   Leukocytes,Ua NEGATIVE NEGATIVE    Comment: Performed at [facility], [address] [place], [place] [address]  CBG monitoring, ED     Status: Abnormal   Collection Time: 10/01/18  5:27 PM  Result Value Ref Range   Glucose-Capillary 110 (H) 70 - 99 mg/dL  Ethanol     Status: None   Collection Time: 10/01/18  5:29 PM  Result Value Ref Range   Alcohol, Ethyl (B) <10 <10 mg/dL    Comment: (NOTE) Lowest detectable limit for serum alcohol is 10 mg/dL. For medical purposes only. Performed at [facility], [address], [place], [place] [address]   Protime-INR     Status: None   Collection Time: 10/01/18  5:29 PM  Result Value Ref Range  Prothrombin Time 12.8 11.4 - 15.2 seconds   INR 1.0 0.8 - 1.2    Comment: (NOTE) INR goal varies based on device and disease states. Performed at [facility], [address], [place], [place] [address]    APTT     Status: None   Collection Time: 10/01/18  5:29 PM  Result Value Ref Range   aPTT 26 24 - 36 seconds    Comment: Performed at [facility], [address] [place], [place] [address]  CBC     Status: Abnormal   Collection Time: 10/01/18  5:29 PM  Result Value Ref Range   WBC 6.9 4.0 - 10.5 K/uL   RBC 4.52 3.87 - 5.11 MIL/uL   Hemoglobin 11.8 (L) 12.0 - 15.0 g/dL   HCT 37.5 36.0 - 46.0 %   MCV 83.0 80.0 - 100.0 fL   MCH 26.1 26.0 - 34.0 pg   MCHC 31.5 30.0 - 36.0 g/dL   RDW 15.4 11.5 - 15.5 %   Platelets 208 150 - 400 K/uL   nRBC 0.0 0.0 - 0.2 %    Comment: Performed at [facility], [address] [place], [place] [address]  Differential     Status: None   Collection Time: 10/01/18  5:29 PM  Result Value Ref Range   Neutrophils Relative % 78 %   Neutro Abs 5.4 1.7 - 7.7 K/uL   Lymphocytes Relative 13 %   Lymphs Abs 0.9 0.7 - 4.0 K/uL   Monocytes Relative 7 %   Monocytes Absolute 0.5 0.1 - 1.0 K/uL   Eosinophils Relative 1 %   Eosinophils Absolute 0.1 0.0 - 0.5 K/uL   Basophils Relative 1 %   Basophils Absolute 0.1 0.0 - 0.1 K/uL   Immature Granulocytes 0 %   Abs Immature Granulocytes 0.02 0.00 - 0.07 K/uL    Comment: Performed at [facility], [address], [place], [place] [address]  Comprehensive metabolic panel     Status: Abnormal   Collection Time: 10/01/18  5:29 PM  Result Value Ref Range   Sodium 136 135 - 145 mmol/L   Potassium 3.2 (L) 3.5 - 5.1 mmol/L   Chloride 106 98 - 111 mmol/L   CO2 23 22 - 32 mmol/L   Glucose, Bld 102 (H) 70 - 99 mg/dL   BUN 17 6 - 20 mg/dL   Creatinine, Ser 1.05 (H) 0.44 - 1.00 mg/dL   Calcium 8.3 (L) 8.9 - 10.3 mg/dL   Total Protein 7.3 6.5 - 8.1 g/dL   Albumin 3.7 3.5 - 5.0 g/dL   AST 22 15 - 41 U/L   ALT 18 0 - 44 U/L   Alkaline Phosphatase 44 38 - 126 U/L   Total Bilirubin 0.4 0.3 - 1.2 mg/dL   GFR calc non Af Amer >60 >60 mL/min   GFR calc Af Amer >60 >60 mL/min   Anion gap 7 5 - 15    Comment: Performed at [facility], [address], [place], [place] [address]  Troponin I - ONCE - STAT     Status: Abnormal   Collection Time: 10/01/18  5:29 PM  Result Value Ref Range   Troponin I 0.04 (HH) <0.03 ng/mL    Comment: CRITICAL RESULT CALLED TO, READ BACK BY AND VERIFIED WITH: Enrique Sack AT 1840 ON 4.22.20 BY ISLEY,B Performed at [facility], [address], [place],  [address]   POC urine preg, ED     Status: None   Collection Time: 10/01/18  7:08 PM  Result Value Ref Range   Preg Test, Ur NEGATIVE NEGATIVE    Comment:        THE SENSITIVITY OF THIS METHODOLOGY IS >24 mIU/mL   Hemoglobin A1c     Status: None   Collection Time: 10/02/18  6:09 AM  Result Value Ref Range   Hgb A1c MFr Bld 4.9 4.8 - 5.6 %    Comment: (NOTE) Pre diabetes:          5.7%-6.4% Diabetes:              >6.4% Glycemic control for   <7.0% adults with diabetes    Mean Plasma Glucose 93.93 mg/dL    Comment: Performed at [facility] [address], [place], [place] [address]  Lipid panel     Status: Abnormal   Collection Time: 10/02/18  6:09 AM  Result Value Ref Range   Cholesterol 169 0 - 200 mg/dL   Triglycerides 87 <150 mg/dL   HDL 40 (L) >40 mg/dL   Total CHOL/HDL Ratio 4.2 RATIO   VLDL 17 0 - 40 mg/dL   LDL Cholesterol 112 (H) 0 - 99 mg/dL    Comment:        Total Cholesterol/HDL:CHD Risk Coronary Heart Disease Risk Table                     Men   Women  1/2 Average Risk   3.4   3.3  Average Risk       5.0   4.4  2 X Average Risk   9.6   7.1  3 X Average Risk  23.4   11.0        Use the calculated Patient Ratio above and the CHD Risk Table to determine the patient's CHD Risk.        ATP III CLASSIFICATION (LDL):  <100     mg/dL   Optimal  100-129  mg/dL   Near or Above                    Optimal  130-159  mg/dL   Borderline  160-189  mg/dL   High  >190     mg/dL   Very High Performed at [facility] [place], [place] [address]     Studies/Results:  Carotid duplex  Doppler: IMPRESSION: Normal carotid duplex ultrasound demonstrating no evidence of carotid plaque or stenosis bilaterally.    ECHO   1. The left ventricle has normal systolic function, with an ejection fraction of 60-65%. The cavity size was mildly dilated. There is mild concentric left ventricular hypertrophy. Left ventricular diastolic parameters were normal. No evidence of left  ventricular regional wall motion abnormalities.  2. The mitral valve is grossly normal.  3. The tricuspid valve was grossly normal.  4. The aortic valve is tricuspid Aortic valve regurgitation is trivial by color flow Doppler.  5. The aortic root is normal in size and structure.  6. No atrial level shunt detected by color flow Doppler. Agitated saline contrast was given intravenously to evaluate for intracardiac shunting. Saline contrast bubble study was negative, with no evidence of any interatrial shunt.    BRAIN MRA FINDINGS: Both vertebral arteries patent to the basilar without significant stenosis. Right PICA patent. Left PICA not visualized. Basilar widely patent. Superior cerebellar and posterior cerebral arteries patent bilaterally. Patent posterior communicating artery bilaterally.  Internal carotid artery patent bilaterally without stenosis. Anterior and middle cerebral  arteries patent bilaterally without stenosis.  Negative for aneurysm  IMPRESSION: Negative   BRAIN MRI FINDINGS: Brain: Punctate area of restricted diffusion left frontal cortex compatible with acute infarct. This could affect speech. No other acute infarct.  Chronic infarct left cerebellum as noted on CT. Small chronic infarct in the left parietal lobe. Ventricle size normal. No hemorrhage or mass lesion  Vascular: Normal arterial flow voids.  Skull and upper cervical spine: Negative  Sinuses/Orbits: Mild mucosal edema paranasal sinuses.  Normal orbit  Other: None  IMPRESSION: Punctate acute  infarct in the left frontal cortex. No other acute infarct.  Chronic infarcts in the left cerebellum.    The brain MRI and MRA are reviewed in person.  There is a tiny infarct seen involving the left frontal cortex.  The area of increase in the actually is then a pit of a solstice.  There is a evidence of increased signal seen in the deep white matter consistent with likely a few microvascular changes.  There is 1 area however involving left parietal region which is concerning for possible small cortical remote ischemia.  There is likely encephalomalacia seen on T1 although no findings are noted on flair suggestive of remote infarct involving the left inferior cerebellum.  MRA shows no occlusive disease intracranially.  The left PICA is absent in the right is quite prominent.     [place] Lapage A. Merlene Laughter, M.D.  Diplomate, Tax adviser of Psychiatry and Neurology ( Neurology). 10/02/2018, 5:21 PM

## 2018-10-02 NOTE — Progress Notes (Signed)
CT CODE STROKE TIMES 1712 CALL TIME 1712 BEEPER TIME 1716 EXAM STARTED 1717 EXAM FINISHED 1721 EXAM COMPLETE 1721 [facility] CALLED

## 2018-10-02 NOTE — Plan of Care (Signed)
progressing 

## 2018-10-02 NOTE — Evaluation (Signed)
Physical Therapy Evaluation Patient Details Name: Kelly Torres MRN: [id number] DOB: 1976-01-30 Today's Date: 10/02/2018   History of Present Illness  Kelly Torres is a 43 y.o. female with hx of DVT, CVA 2015, DJD, anemia, back pain w/ hx of back surgeries x 2 in 2013-14, presented to ED today for episode of numbness/tingling and weakness of L arm since awakening this morning. Has been off her Xarelto for about a month due to insurance issues. Seen by Telehealth MD, symptoms have resolved, TPA not given.  NIHSS was 0. Recommendations were to activate the CVA admission/ order set and admit to neuro/ stroke floor, do neurochecks, bedside swallow, and give ASA if no contraindication. We are asked to see for admission.     Clinical Impression  Patient functioning at baseline for functional mobility and gait.  Plan:  Patient discharged from physical therapy to care of nursing for ambulation daily as tolerated for length of stay.     Follow Up Recommendations No PT follow up    Equipment Recommendations  None recommended by PT    Recommendations for Other Services       Precautions / Restrictions Precautions Precautions: None Restrictions Weight Bearing Restrictions: No      Mobility  Bed Mobility Overal bed mobility: Independent                Transfers Overall transfer level: Independent Equipment used: None                Ambulation/Gait Ambulation/Gait assistance: Independent Gait Distance (Feet): 200 Feet Assistive device: None Gait Pattern/deviations: WFL(Within Functional Limits) Gait velocity: normal   General Gait Details: demonstrates good return for ambulation on level, inclined, and declined surfaces without loss of balance  Stairs Stairs: Yes Stairs assistance: Modified independent (Device/Increase time) Stair Management: One rail Right;One rail Left;Alternating pattern Number of Stairs: 4 General stair comments: demonstrates good return for  going up/down 4 steps using 1 siderail without loss of balance  Wheelchair Mobility    Modified Rankin (Stroke Patients Only)       Balance Overall balance assessment: No apparent balance deficits (not formally assessed)                                           Pertinent Vitals/Pain Pain Assessment: No/denies pain    Home Living Family/patient expects to be discharged to:: Private residence Living Arrangements: Spouse/significant other;Children Available Help at Discharge: Family;Available PRN/intermittently Type of Home: House Home Access: Stairs to enter Entrance Stairs-Rails: None Entrance Stairs-Number of Steps: 2 Home Layout: One level Home Equipment: Walker - 2 wheels;Grab bars - tub/shower;Walker - 4 wheels;Bedside commode      Prior Function Level of Independence: Independent         Comments: community ambulator, drives     Hand Dominance   Dominant Hand: Left    Extremity/Trunk Assessment   Upper Extremity Assessment Upper Extremity Assessment: Defer to OT evaluation    Lower Extremity Assessment Lower Extremity Assessment: Overall WFL for tasks assessed    Cervical / Trunk Assessment Cervical / Trunk Assessment: Normal  Communication   Communication: No difficulties  Cognition Arousal/Alertness: Awake/alert Behavior During Therapy: WFL for tasks assessed/performed Overall Cognitive Status: Within Functional Limits for tasks assessed  General Comments      Exercises     Assessment/Plan    PT Assessment Patent does not need any further PT services  PT Problem List         PT Treatment Interventions      PT Goals (Current goals can be found in the Care Plan section)  Acute Rehab PT Goals Patient Stated Goal: return home  PT Goal Formulation: With patient Time For Goal Achievement: 10/02/18 Potential to Achieve Goals: Good    Frequency     Barriers to  discharge        Co-evaluation               AM-PAC PT "6 Clicks" Mobility  Outcome Measure Help needed turning from your back to your side while in a flat bed without using bedrails?: None Help needed moving from lying on your back to sitting on the side of a flat bed without using bedrails?: None Help needed moving to and from a bed to a chair (including a wheelchair)?: None Help needed standing up from a chair using your arms (e.g., wheelchair or bedside chair)?: None Help needed to walk in hospital room?: None Help needed climbing 3-5 steps with a railing? : None 6 Click Score: 24    End of Session   Activity Tolerance: Patient tolerated treatment well Patient left: in chair;with call bell/phone within reach Nurse Communication: Mobility status PT Visit Diagnosis: Unsteadiness on feet (R26.81);Other abnormalities of gait and mobility (R26.89);Muscle weakness (generalized) (M62.81)    Time: 6433-2951 PT Time Calculation (min) (ACUTE ONLY): 23 min   Charges:   PT Evaluation $PT Eval Moderate Complexity: 1 Mod PT Treatments $Gait Training: 23-37 mins        11:18 AM, 10/02/18 Lonell Grandchild, MPT Physical Therapist with [facility] [phone] office [phone] mobile phone

## 2018-10-02 NOTE — Evaluation (Signed)
Occupational Therapy Evaluation Patient Details Name: Kelly Torres MRN: [id number] DOB: 1976/01/26 Today's Date: 10/02/2018    History of Present Illness Kelly Torres is a 43 y.o. female with hx of DVT, CVA 2015, DJD, anemia, back pain w/ hx of back surgeries x 2 in 2013-14, presented to ED today for episode of numbness/tingling and weakness of L arm since awakening this morning. Has been off her Xarelto for about a month due to insurance issues. Seen by Telehealth MD, symptoms have resolved, TPA not given.  NIHSS was 0. Recommendations were to activate the CVA admission/ order set and admit to neuro/ stroke floor, do neurochecks, bedside swallow, and give ASA if no contraindication. We are asked to see for admission.    Clinical Impression   Pt received supine in bed, agreeable to OT evaluation. Pt reporting LUE numbness has resolved. Pt performing ADLs and functional mobility at baseline independence this am. Coordination and sensation are intact and strength is [facility]. No further OT services required at this time.      Follow Up Recommendations  No OT follow up    Equipment Recommendations  None recommended by OT       Precautions / Restrictions Precautions Precautions: None Restrictions Weight Bearing Restrictions: No      Mobility Bed Mobility Overal bed mobility: Independent                Transfers Overall transfer level: Independent Equipment used: None                      ADL either performed or assessed with clinical judgement   ADL Overall ADL's : Modified independent Eating/Feeding: Independent;Bed level   Grooming: Wash/dry hands;Independent;Standing                   Toilet Transfer: Independent;Ambulation;Regular Toilet   Toileting- Water quality scientist and Hygiene: Independent;Sit to/from stand       Functional mobility during ADLs: Modified independent       Vision Baseline Vision/History: Wears glasses Wears Glasses: At  all times Patient Visual Report: No change from baseline Vision Assessment?: No apparent visual deficits            Pertinent Vitals/Pain Pain Assessment: No/denies pain     Hand Dominance Left   Extremity/Trunk Assessment Upper Extremity Assessment Upper Extremity Assessment: Overall WFL for tasks assessed   Lower Extremity Assessment Lower Extremity Assessment: Defer to PT evaluation   Cervical / Trunk Assessment Cervical / Trunk Assessment: Normal   Communication Communication Communication: No difficulties   Cognition Arousal/Alertness: Awake/alert Behavior During Therapy: WFL for tasks assessed/performed Overall Cognitive Status: Within Functional Limits for tasks assessed                                                Home Living Family/patient expects to be discharged to:: Private residence Living Arrangements: Spouse/significant other;Children Available Help at Discharge: Family;Available PRN/intermittently Type of Home: Apartment Home Access: Stairs to enter Entrance Stairs-Number of Steps: 2 Entrance Stairs-Rails: None Home Layout: One level     Bathroom Shower/Tub: Teacher, early years/pre: Standard     Home Equipment: Walker - 2 wheels;Grab bars - tub/shower(RW from previous CVA, does not use)          Prior Functioning/Environment Level of Independence: Independent  OT Problem List: Decreased strength       AM-PAC OT "6 Clicks" Daily Activity     Outcome Measure Help from another person eating meals?: None Help from another person taking care of personal grooming?: None Help from another person toileting, which includes using toliet, bedpan, or urinal?: None Help from another person bathing (including washing, rinsing, drying)?: None Help from another person to put on and taking off regular upper body clothing?: None Help from another person to put on and taking off regular lower body  clothing?: None 6 Click Score: 24   End of Session Nurse Communication: Mobility status  Activity Tolerance: Patient tolerated treatment well Patient left: in chair;with call bell/phone within reach  OT Visit Diagnosis: Muscle weakness (generalized) (M62.81)                Time: 0929-5747 OT Time Calculation (min): 15 min Charges:  OT General Charges $OT Visit: 1 Visit OT Evaluation $OT Eval Low Complexity: [place], OTR/L  [phone] 10/02/2018, 8:12 AM

## 2018-10-02 NOTE — TOC Transition Note (Signed)
Transition of Care [facility]) - CM/SW Discharge Note   Patient Details  Name: Kelly Torres MRN: [id number] Date of Birth: 02-05-1976  [facility] CM/SW Contact:  Alicianna Litchford, Chauncey Reading, RN Phone Number: 10/02/2018, 2:28 PM   Clinical Narrative:   CM consulted to make sure patient can obtain her Xarelto. Discussed with patient, she is now receiving full Medicaid benefits and Xarelto is still on the preferred drug list. Call to [facility] in [place], they do have Xarelto in stock.   Expected Discharge Plan: Home/Self Care Barriers to Discharge: No Barriers Identified   Expected Discharge Plan and Services Expected Discharge Plan: Home/Self Care   Discharge Planning Services: CM Consult, Medication Assistance     Expected Discharge Date: 10/02/18                                                    Activities of Daily Living Home Assistive Devices/Equipment: None ADL Screening (condition at time of admission) Patient's cognitive ability adequate to safely complete daily activities?: Yes Is the patient deaf or have difficulty hearing?: No Does the patient have difficulty seeing, even when wearing glasses/contacts?: No Does the patient have difficulty concentrating, remembering, or making decisions?: No Patient able to express need for assistance with ADLs?: Yes Does the patient have difficulty dressing or bathing?: No Independently performs ADLs?: Yes (appropriate for developmental age) Does the patient have difficulty walking or climbing stairs?: No Weakness of Legs: None Weakness of Arms/Hands: Left   Admission diagnosis:  TIA (transient ischemic attack) [G45.9] Cerebrovascular accident (CVA), unspecified mechanism ([place]) [I63.9] Nonadherence to medication [Z91.14] Patient Active Problem List   Diagnosis Date Noted  . Left arm numbness 10/01/2018  . Left arm weakness 10/01/2018  . TIA (transient ischemic attack) 10/01/2018  . Nonadherence to  medication   . History of deep vein thrombosis (DVT) of lower extremity   . History of cerebellar stroke   . heavy periods 11/06/2011   PCP:  Meliton Rattan, MD Pharmacy:   [facility] [address] [place] [address] [place] [address] Phone: [phone] Fax: [phone]      Readmission Risk Interventions Readmission Risk Prevention Plan 10/02/2018  Post Dischage Appt Complete  Medication Screening Complete  Transportation Screening Complete  Some recent data might be hidden      Final next level of care: Home/Self Care Barriers to Discharge: No Barriers Identified      Discharge Plan and Services   Discharge Planning Services: CM Consult, Medication Assistance                                     Readmission Risk Interventions Readmission Risk Prevention Plan 10/02/2018  Post Dischage Appt Complete  Medication Screening Complete  Transportation Screening Complete  Some recent data might be hidden

## 2018-10-03 ENCOUNTER — Inpatient Hospital Stay (HOSPITAL_COMMUNITY)
Admit: 2018-10-03 | Discharge: 2018-10-03 | Disposition: A | Discharge status: Home / Self Care | Payer: Medicaid Other | Attending: Neurology | Admitting: Neurology

## 2018-10-03 DIAGNOSIS — I639 Cerebral infarction, unspecified: Principal | ICD-10-CM

## 2018-10-03 LAB — HIV ANTIBODY (ROUTINE TESTING W REFLEX): HIV Screen 4th Generation wRfx: NONREACTIVE

## 2018-10-03 MED ORDER — ATORVASTATIN CALCIUM 40 MG PO TABS: 40.0000 mg | ORAL_TABLET | Freq: Every day | ORAL | 0 refills | Status: AC

## 2018-10-03 MED ORDER — ASPIRIN 325 MG PO TABS: 325.0000 mg | ORAL_TABLET | Freq: Every day | ORAL | Status: AC

## 2018-10-03 NOTE — Procedures (Signed)
  [place] A. Merlene Laughter, MD     [email or web address]           HISTORY: The patient is a 43 year old who presents with focal neurological symptoms with negative MRI worrisome for epileptic phenomena /seizures causing the focal neurological symptoms.  MEDICATIONS:  Current Facility-Administered Medications:  .  acetaminophen (TYLENOL) tablet 650 mg, 650 mg, Oral, Q4H PRN, 650 mg at 10/02/18 1922 **OR** acetaminophen (TYLENOL) solution 650 mg, 650 mg, Per Tube, Q4H PRN **OR** acetaminophen (TYLENOL) suppository 650 mg, 650 mg, Rectal, Q4H PRN, Roney Jaffe, MD .  aspirin tablet 325 mg, 325 mg, Oral, Daily, Roney Jaffe, MD, 325 mg at 10/03/18 1050 .  atorvastatin (LIPITOR) tablet 40 mg, 40 mg, Oral, q1800, Johnson, Clanford L, MD, 40 mg at 10/02/18 1720 .  cyclobenzaprine (FLEXERIL) tablet 10 mg, 10 mg, Oral, QHS PRN, Roney Jaffe, MD .  pantoprazole (PROTONIX) EC tablet 40 mg, 40 mg, Oral, Daily, Roney Jaffe, MD, 40 mg at 10/03/18 1050 .  senna-docusate (Senokot-S) tablet 1 tablet, 1 tablet, Oral, QHS PRN, Roney Jaffe, MD     ANALYSIS: A 16 channel recording using standard 10 20 measurements is conducted for 23 minutes.  There is a well-formed posterior dominant rhythm of 12 Hz which attenuates with eye opening.  There is beta activity observed in the frontal areas.  Awake and drowsy activities are observed which transitions to K complexes and sleep spindles.  This is actually observed in most of the recording indicating stage II non-REM sleep.  Photic stimulation and hyperventilation are not conducted.  There is no focal or lateral slowing.  There is no epileptiform activity observed.   IMPRESSION: 1.  This is a normal recording of the awake and asleep states.      Levia Waltermire A. Merlene Laughter, M.D.  Diplomate, Tax adviser of Psychiatry and Neurology ( Neurology).

## 2018-10-03 NOTE — Progress Notes (Signed)
Removed IV-clean, dry, intact. Genell reviewed d/c paperwork with patient. Reviewed new meds and med changes. Answered all questions. Genell wheeled stable patient and belongings to short stay entrance where she was picked up to d/c to home.

## 2018-10-03 NOTE — Progress Notes (Signed)
EEG complete - results pending 

## 2018-10-03 NOTE — Discharge Instructions (Signed)
IMPORTANT INFORMATION: PAY CLOSE ATTENTION    PHYSICIAN DISCHARGE INSTRUCTIONS  Follow with Primary care provider  Hansell, Sherrilee Gilles, MD  and other consultants as instructed your Hospitalist Physician  SEEK MEDICAL CARE OR RETURN TO EMERGENCY ROOM IF SYMPTOMS COME BACK, WORSEN OR NEW PROBLEM DEVELOPS.   Please note: You were cared for by a hospitalist during your hospital stay. Every effort will be made to forward records to your primary care provider.  You can request that your primary care provider send for your hospital records if they have not received them.  Once you are discharged, your primary care physician will handle any further medical issues. Please note that NO REFILLS for any discharge medications will be authorized once you are discharged, as it is imperative that you return to your primary care physician (or establish a relationship with a primary care physician if you do not have one) for your post hospital discharge needs so that they can reassess your need for medications and monitor your lab values.  Please get a complete blood count and chemistry panel checked by your Primary MD at your next visit, and again as instructed by your Primary MD.  Get Medicines reviewed and adjusted: Please take all your medications with you for your next visit with your Primary MD  Laboratory/radiological data: Please request your Primary MD to go over all hospital tests and procedure/radiological results at the follow up, please ask your primary care provider to get all Hospital records sent to his/her office.  In some cases, they will be blood work, cultures and biopsy results pending at the time of your discharge. Please request that your primary care provider follow up on these results.  If you are diabetic, please bring your blood sugar readings with you to your follow up appointment with primary care.    Please call and make your follow up appointments as soon as possible.      Also Note the following: If you experience worsening of your admission symptoms, develop shortness of breath, life threatening emergency, suicidal or homicidal thoughts you must seek medical attention immediately by calling 911 or calling your MD immediately  if symptoms less severe.  You must read complete instructions/literature along with all the possible adverse reactions/side effects for all the Medicines you take and that have been prescribed to you. Take any new Medicines after you have completely understood and accpet all the possible adverse reactions/side effects.   Do not drive when taking Pain medications or sleeping medications (Benzodiazepines)  Do not take more than prescribed Pain, Sleep and Anxiety Medications. It is not advisable to combine anxiety,sleep and pain medications without talking with your primary care practitioner  Special Instructions: If you have smoked or chewed Tobacco  in the last 2 yrs please stop smoking, stop any regular Alcohol  and or any Recreational drug use.  Wear Seat belts while driving.

## 2018-10-03 NOTE — Discharge Summary (Addendum)
Physician Discharge Summary  Kelly Torres WLN:[id number] DOB: 1975/09/28 DOA: 10/01/2018  PCP: Meliton Rattan, MD Neurologist: Merlene Laughter [facility] Neurology) and Dione Booze [facility] Neurology)  Admit date: 10/01/2018 Discharge date: 10/03/2018  Admitted From: Home  Disposition: Home   Recommendations for Outpatient Follow-up:  1. Follow up with PCP in 1 weeks 2. Follow up with Dr. Merlene Laughter in 2 weeks for EEG results 3. Follow up with Dr. Dione Booze in 1 month for stroke follow up visit 4. Please check BMP in 1-2 week to follow potassium level.   Discharge Condition: STABLE   CODE STATUS: FULL    Brief Hospitalization Summary: Please see all hospital notes, images, labs for full details of the hospitalization. Dr. Jonnie Finner HPI: Kelly Torres is a 43 y.o. female with hx of DVT, CVA 2015, DJD, anemia, back pain w/ hx of back surgeries x 2 in 2013-14, presented to ED today for episode of numbness/tingling and weakness of L arm since awakening this morning. Has been off her Xarelto for about a month due to insurance issues. Seen by Telehealth MD, symptoms have resolved, TPA not given.  NIHSS was 0. Recommendations were to activate the CVA admission/ order set and admit to neuro/ stroke floor, do neurochecks, bedside swallow, and give ASA if no contraindication. We are asked to see for admission.   Patient states she had L arm / hand numbness mostly and mild weakness around 2pm today at work.  Feeling better now.  Had DVT R leg in 2018 and has been on Xarelto since, followed by MD in [place].  Pt ran out recently due to insurance issues, about 1 mo ago.    Pt lives in [place], [place] w/ her husband.  No etoh/ tobacco.  No hx HTN or DM2.    EChart:  Admitted 01/2012 for R L4-5 discectomy done for herniated disc w/ lumbago and radiculopathy/ spinal stenosis.   Admitted Jan 2014 for redo R L4-5 discectomy , mild post op fever, w/u neg, dc'd  Brief Admission Hx: 43 year old female  with history of recurrent DVT, CVA in 2015, anemia, chronic back pain, DJD presented with left arm weakness and numbness and discovered by MRI to have an acute CVA.  MDM/Assessment & Plan:   1. Acute CVA- patient was admitted for further work-up she has been started on aspirin for antiplatelet therapy.  Her MRI reveals a small left frontal CVA.  Her LDL is suboptimal and she has been started on atorvastatin 40 mg daily.  Neurology consult has been requested and patient was seen by neurology and continuing aspirin 325 mg was recommended. EEG was ordered.  Pt to follow up results outpatient for EEG. Echo noted below with no significant abnormalities.   The patient was on telemetry monitoring.  No PT/OT/SLP needs were identified.   2. History of DVT - Pt says she only had 1 DVT event and thus already completed 6 months of xarelto.  Follow up with PCP for further advice and management.   3. Morbid obesity-patient diet and exercise counseling at bedside. 4. DDD with chronic symptoms-status post discectomy at x2.  DVT prophylaxis: Xarelto Code Status: Full Family Communication: Patient fully updated at bedside Disposition Plan: Home  Consultants:  Neurology  Procedures:  MRI brain  Antimicrobials:  N/A  Discharge Diagnoses:  Active Problems:   Left arm numbness   Left arm weakness   TIA (transient ischemic attack)   Acute CVA (cerebrovascular accident) [facility])   Discharge Instructions: Discharge Instructions  Increase activity slowly   Complete by:  As directed      Allergies as of 10/03/2018      Reactions   Latex Rash   Allergic only to condoms, no problems with elastic in clothes, gloves etc      Medication List    STOP taking these medications   pantoprazole 40 MG tablet Commonly known as:  PROTONIX   Xarelto 20 MG Tabs tablet Generic drug:  rivaroxaban     TAKE these medications   acetaminophen 500 MG tablet Commonly known as:  TYLENOL Take 1,000 mg by  mouth every 6 (six) hours as needed.   aspirin 325 MG tablet Take 1 tablet (325 mg total) by mouth daily. Start taking on:  October 04, 2018   atorvastatin 40 MG tablet Commonly known as:  LIPITOR Take 1 tablet (40 mg total) by mouth daily at 6 PM for 30 days.   cyclobenzaprine 10 MG tablet Commonly known as:  FLEXERIL Take 1 tablet by mouth at bedtime as needed.   diclofenac sodium 1 % Gel Commonly known as:  VOLTAREN Apply 2 g topically 2 (two) times daily.   omeprazole 40 MG capsule Commonly known as:  PRILOSEC Take 40 mg by mouth daily.      Follow-up Information    Hansell, Sherrilee Gilles, MD Follow up.   Specialty:  Family Medicine Why:  MD office will call you after DC to arrange follow-up appointment. Contact information: [place] [address] [phone]        Mickel Fuchs, MD. Schedule an appointment as soon as possible for a visit in 3 week(s).   Specialty:  Psychiatry Why:  Hospital Follow Up Stroke Contact information: [place] [address] [phone]        Phillips Odor, MD. Schedule an appointment as soon as possible for a visit in 2 week(s).   Specialty:  Neurology Why:  Follow Up EEG results and stroke follow up  Contact information: [address] [place] [address] [phone]          Allergies  Allergen Reactions  . Latex Rash    Allergic only to condoms, no problems with elastic in clothes, gloves etc   Allergies as of 10/03/2018      Reactions   Latex Rash   Allergic only to condoms, no problems with elastic in clothes, gloves etc      Medication List    STOP taking these medications   pantoprazole 40 MG tablet Commonly known as:  PROTONIX   Xarelto 20 MG Tabs tablet Generic drug:  rivaroxaban     TAKE these medications   acetaminophen 500 MG tablet Commonly known as:  TYLENOL Take 1,000 mg by mouth every 6 (six) hours as needed.    aspirin 325 MG tablet Take 1 tablet (325 mg total) by mouth daily. Start taking on:  October 04, 2018   atorvastatin 40 MG tablet Commonly known as:  LIPITOR Take 1 tablet (40 mg total) by mouth daily at 6 PM for 30 days.   cyclobenzaprine 10 MG tablet Commonly known as:  FLEXERIL Take 1 tablet by mouth at bedtime as needed.   diclofenac sodium 1 % Gel Commonly known as:  VOLTAREN Apply 2 g topically 2 (two) times daily.   omeprazole 40 MG capsule Commonly known as:  PRILOSEC Take 40 mg by mouth daily.       Procedures/Studies: Echocardiogram 4/20 IMPRESSIONS  1. The  left ventricle has normal systolic function, with an ejection fraction of 60-65%. The cavity size was mildly dilated. There is mild concentric left ventricular hypertrophy. Left ventricular diastolic parameters were normal. No evidence of left  ventricular regional wall motion abnormalities.  2. The mitral valve is grossly normal.  3. The tricuspid valve was grossly normal.  4. The aortic valve is tricuspid Aortic valve regurgitation is trivial by color flow Doppler.  5. The aortic root is normal in size and structure.  6. No atrial level shunt detected by color flow Doppler. Agitated saline contrast was given intravenously to evaluate for intracardiac shunting. Saline contrast bubble study was negative, with no evidence of any interatrial shunt.  Dg Chest 2 View  Result Date: 10/01/2018 CLINICAL DATA:  TIA EXAM: CHEST - 2 VIEW COMPARISON:  08/05/2017 FINDINGS: Cardiac enlargement with normal vascularity. Negative for heart failure. Negative for infiltrate effusion or edema. Lungs are clear. IMPRESSION: No active cardiopulmonary disease. Electronically Signed   By: Franchot Gallo M.D.   On: 10/01/2018 20:12   Mr Brain Wo Contrast (neuro Protocol)  Result Date: 10/01/2018 CLINICAL DATA:  Focal neurologic deficit. Left arm numbness and tingling today EXAM: MRI HEAD WITHOUT CONTRAST TECHNIQUE: Multiplanar,  multiecho pulse sequences of the brain and surrounding structures were obtained without intravenous contrast. COMPARISON:  CT head today FINDINGS: Brain: Punctate area of restricted diffusion left frontal cortex compatible with acute infarct. This could affect speech. No other acute infarct. Chronic infarct left cerebellum as noted on CT. Small chronic infarct in the left parietal lobe. Ventricle size normal. No hemorrhage or mass lesion Vascular: Normal arterial flow voids. Skull and upper cervical spine: Negative Sinuses/Orbits: Mild mucosal edema paranasal sinuses.  Normal orbit Other: None IMPRESSION: Punctate acute infarct in the left frontal cortex. No other acute infarct. Chronic infarcts in the left cerebellum. Electronically Signed   By: Franchot Gallo M.D.   On: 10/01/2018 18:28   US Carotid Bilateral (at [facility] Only)  Result Date: 10/02/2018 CLINICAL DATA:  Acute small left frontal cerebral infarct by MRI. EXAM: BILATERAL CAROTID DUPLEX ULTRASOUND TECHNIQUE: Pearline Cables scale imaging, color Doppler and duplex ultrasound were performed of bilateral carotid and vertebral arteries in the neck. COMPARISON:  None. FINDINGS: Criteria: Quantification of carotid stenosis is based on velocity parameters that correlate the residual internal carotid diameter with NASCET-based stenosis levels, using the diameter of the distal internal carotid lumen as the denominator for stenosis measurement. The following velocity measurements were obtained: RIGHT ICA:  97/37 cm/sec CCA:  40/97 cm/sec SYSTOLIC ICA/CCA RATIO:  1.1 ECA:  54 cm/sec LEFT ICA:  94/38 cm/sec CCA:  35/32 cm/sec SYSTOLIC ICA/CCA RATIO:  1.2 ECA:  42 cm/sec RIGHT CAROTID ARTERY: No focal plaque is identified. There is no evidence right-sided carotid stenosis. RIGHT VERTEBRAL ARTERY: Antegrade flow with normal waveform and velocity. LEFT CAROTID ARTERY: No focal plaque is identified. There is no evidence of left-sided carotid stenosis. LEFT VERTEBRAL  ARTERY: Antegrade flow with normal waveform and velocity. IMPRESSION: Normal carotid duplex ultrasound demonstrating no evidence of carotid plaque or stenosis bilaterally. Electronically Signed   By: Aletta Edouard M.D.   On: 10/02/2018 12:14   Mr Jodene Nam Head (cerebral Arteries)  Result Date: 10/01/2018 CLINICAL DATA:  Stroke symptoms.  Numbness tingling left arm EXAM: MRA HEAD WITHOUT CONTRAST TECHNIQUE: Angiographic images of the Circle of Willis were obtained using MRA technique without intravenous contrast. COMPARISON:  MRI head today FINDINGS: Both vertebral arteries patent to the basilar without significant stenosis.  Right PICA patent. Left PICA not visualized. Basilar widely patent. Superior cerebellar and posterior cerebral arteries patent bilaterally. Patent posterior communicating artery bilaterally. Internal carotid artery patent bilaterally without stenosis. Anterior and middle cerebral arteries patent bilaterally without stenosis. Negative for aneurysm IMPRESSION: Negative Electronically Signed   By: Franchot Gallo M.D.   On: 10/01/2018 18:32   Ct Head Code Stroke Wo Contrast  Result Date: 10/01/2018 CLINICAL DATA:  Code stroke.  Numbness tingling left arm today EXAM: CT HEAD WITHOUT CONTRAST TECHNIQUE: Contiguous axial images were obtained from the base of the skull through the vertex without intravenous contrast. COMPARISON:  None. FINDINGS: Brain: Ventricle size normal.  Negative for hemorrhage or mass. Small chronic infarct left superior cerebellum. Additional small hypodensities left cerebellum consistent with infarct of indeterminate age. Vascular: Negative for hyperdense vessel Skull: Negative Sinuses/Orbits: Negative Other: None ASPECTS ([place] Stroke Program Early CT Score) - Ganglionic level infarction (caudate, lentiform nuclei, internal capsule, insula, M1-M3 cortex): 7 - Supraganglionic infarction (M4-M6 cortex): 3 Total score (0-10 with 10 being normal): 10 IMPRESSION: 1. No acute  intracranial abnormality 2. Small chronic appearing infarct left superior cerebellum. Additional small areas of infarct in the left cerebellum of indeterminate age. 3. ASPECTS is 10 4. These results were called by telephone at the time of interpretation on 10/01/2018 at 5:30 pm to Dr. Francine Graven , who verbally acknowledged these results. Electronically Signed   By: Franchot Gallo M.D.   On: 10/01/2018 17:31     Subjective: Pt says she feels much better.  Her full strength in her extremities has resumed. No difficulties with speech or swallowing.    Discharge Exam: Vitals:   10/03/18 0407 10/03/18 0820  BP: 132/69 132/79  Pulse: 66 63  Resp:  19  Temp: 97.8 F (36.6 C) 98.2 F (36.8 C)  SpO2: 99% 96%   Vitals:   10/02/18 2020 10/03/18 0004 10/03/18 0407 10/03/18 0820  BP: 122/72 (!) 121/59 132/69 132/79  Pulse: 60 (!) 58 66 63  Resp: 20   19  Temp: 97.7 F (36.5 C) 97.8 F (36.6 C) 97.8 F (36.6 C) 98.2 F (36.8 C)  TempSrc: Oral Oral Oral Oral  SpO2: 99% 100% 99% 96%  Weight:      Height:        General: Pt is alert, awake, not in acute distress Cardiovascular: RRR, S1/S2 +, no rubs, no gallops Respiratory: CTA bilaterally, no wheezing, no rhonchi Abdominal: Soft, NT, ND, bowel sounds + Extremities: no edema, no cyanosis   The results of significant diagnostics from this hospitalization (including imaging, microbiology, ancillary and laboratory) are listed below for reference.     Microbiology: No results found for this or any previous visit (from the past 240 hour(s)).   Labs: BNP (last 3 results) No results for input(s): BNP in the last 8760 hours. Basic Metabolic Panel: Recent Labs  Lab 10/01/18 1729  NA 136  K 3.2*  CL 106  CO2 23  GLUCOSE 102*  BUN 17  CREATININE 1.05*  CALCIUM 8.3*   Liver Function Tests: Recent Labs  Lab 10/01/18 1729  AST 22  ALT 18  ALKPHOS 44  BILITOT 0.4  PROT 7.3  ALBUMIN 3.7   No results for input(s): LIPASE,  AMYLASE in the last 168 hours. No results for input(s): AMMONIA in the last 168 hours. CBC: Recent Labs  Lab 10/01/18 1729  WBC 6.9  NEUTROABS 5.4  HGB 11.8*  HCT 37.5  MCV 83.0  PLT 208  Cardiac Enzymes: Recent Labs  Lab 10/01/18 1729  TROPONINI 0.04*   BNP: Invalid input(s): POCBNP CBG: Recent Labs  Lab 10/01/18 1727  GLUCAP 110*   D-Dimer No results for input(s): DDIMER in the last 72 hours. Hgb A1c Recent Labs    10/02/18 0609  HGBA1C 4.9   Lipid Profile Recent Labs    10/02/18 0609  CHOL 169  HDL 40*  LDLCALC 112*  TRIG 87  CHOLHDL 4.2   Thyroid function studies Recent Labs    10/01/18 1729  TSH 2.631   Anemia work up Recent Labs    10/01/18 1729  VITAMINB12 399   Urinalysis    Component Value Date/Time   COLORURINE STRAW (A) 10/01/2018 1711   APPEARANCEUR CLEAR 10/01/2018 1711   LABSPEC 1.003 (L) 10/01/2018 1711   PHURINE 6.0 10/01/2018 1711   GLUCOSEU NEGATIVE 10/01/2018 1711   HGBUR NEGATIVE 10/01/2018 1711   BILIRUBINUR NEGATIVE 10/01/2018 1711   KETONESUR NEGATIVE 10/01/2018 1711   PROTEINUR NEGATIVE 10/01/2018 1711   UROBILINOGEN 1.0 06/19/2012 2345   NITRITE NEGATIVE 10/01/2018 1711   LEUKOCYTESUR NEGATIVE 10/01/2018 1711   Sepsis Labs Invalid input(s): PROCALCITONIN,  WBC,  LACTICIDVEN Microbiology No results found for this or any previous visit (from the past 240 hour(s)).  Time coordinating discharge:   SIGNED:  Irwin Brakeman, MD  [facility] 10/03/2018, 1:32 PM How to contact the [facility] Attending or Consulting provider [place] or covering provider during after hours [place], for this patient?  1. Check the care team in [facility] and look for a) attending/consulting [facility] provider listed and b) the [facility] team listed 2. Log into [email or web address] and use [place]'s universal password to access. If you do not have the password, please contact the hospital operator. 3. Locate the [facility] provider you are looking for under  [facility] and page to a number that you can be directly reached. 4. If you still have difficulty reaching the provider, please page the [facility] (Director on Call) for the Hospitalists listed on amion for assistance.

## 2018-10-04 LAB — HOMOCYSTEINE: Homocysteine: 7.1 umol/L (ref 0.0–14.5)

## 2018-10-04 LAB — RPR: RPR Ser Ql: NONREACTIVE

## 2018-10-07 ENCOUNTER — Telehealth: Payer: Self-pay | Admitting: *Deleted

## 2018-10-07 DIAGNOSIS — I639 Cerebral infarction, unspecified: Secondary | ICD-10-CM

## 2018-10-07 NOTE — Telephone Encounter (Signed)
-----   Message from Murlean Iba, MD sent at 10/03/2018  7:33 AM EDT ----- Regarding: 30 day event monitor Dear Karen Kays, Would you please help get this patient set up for a 30 day event monitor as part of her stroke work up? She is an inpatient and should discharge later today.   Thanks very much.    Murvin Natal MD  [facility]

## 2018-10-10 ENCOUNTER — Ambulatory Visit: Payer: Medicaid Other

## 2018-10-13 ENCOUNTER — Other Ambulatory Visit: Payer: Self-pay

## 2019-11-27 ENCOUNTER — Other Ambulatory Visit: Payer: Self-pay | Admitting: Student

## 2019-11-27 DIAGNOSIS — M4316 Spondylolisthesis, lumbar region: Secondary | ICD-10-CM

## 2019-11-27 DIAGNOSIS — M5412 Radiculopathy, cervical region: Secondary | ICD-10-CM

## 2020-01-02 ENCOUNTER — Other Ambulatory Visit: Payer: Medicaid Other

## 2020-01-05 ENCOUNTER — Ambulatory Visit
Admission: RE | Admit: 2020-01-05 | Discharge: 2020-01-05 | Disposition: A | Discharge status: Home / Self Care | Payer: Medicaid Other | Source: Ambulatory Visit | Attending: Student | Admitting: Student

## 2020-01-05 ENCOUNTER — Other Ambulatory Visit: Payer: Self-pay

## 2020-01-05 ENCOUNTER — Other Ambulatory Visit: Payer: Medicaid Other

## 2020-01-05 DIAGNOSIS — M4316 Spondylolisthesis, lumbar region: Secondary | ICD-10-CM

## 2020-01-05 DIAGNOSIS — M5412 Radiculopathy, cervical region: Secondary | ICD-10-CM

## 2020-04-11 DIAGNOSIS — C50919 Malignant neoplasm of unspecified site of unspecified female breast: Secondary | ICD-10-CM

## 2020-04-11 HISTORY — DX: Malignant neoplasm of unspecified site of unspecified female breast: C50.919

## 2021-03-08 IMAGING — CT CT HEAD CODE STROKE W/O CM
3 series · 15 of 47 positions shown, 18 images · non-contrast
Comparison: None.

CLINICAL DATA: Code stroke.  Numbness tingling left arm today

EXAM:
CT HEAD WITHOUT CONTRAST
TECHNIQUE: Contiguous axial images were obtained from the base of the skull
through the vertex without intravenous contrast.

[Series 2: head code stroke wo · axial · 0.47mm/px · z∈[+1657,+1782]mm · 9 of 31 slices shown, 12 images]
[im 3/31  brain]
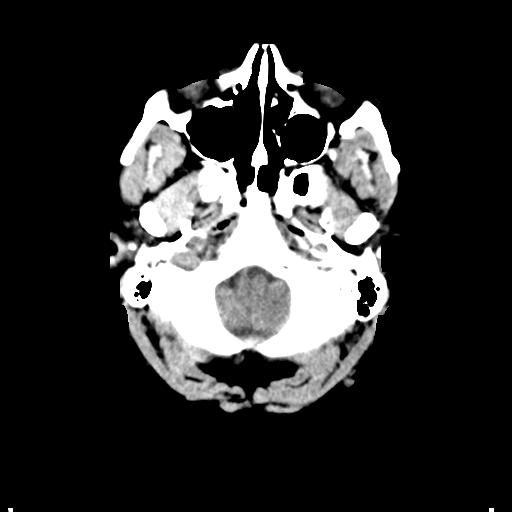
[im 3/31  bone]
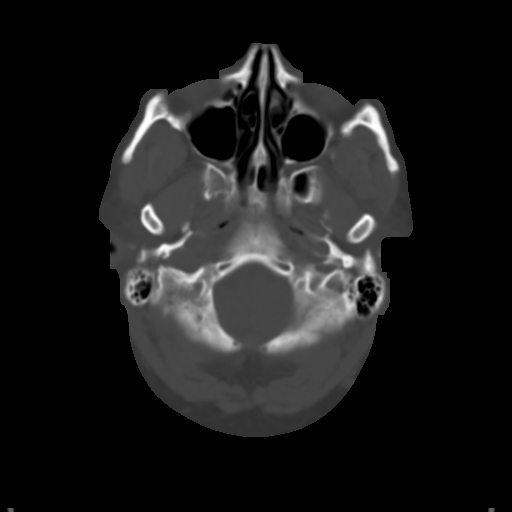
[im 6/31  brain]
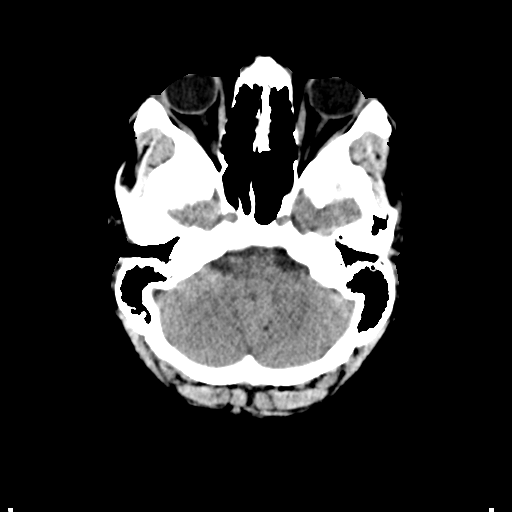
[im 9/31  brain]
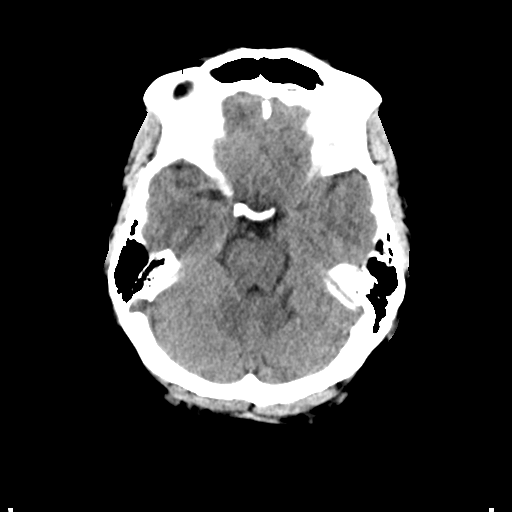
[im 12/31  brain]
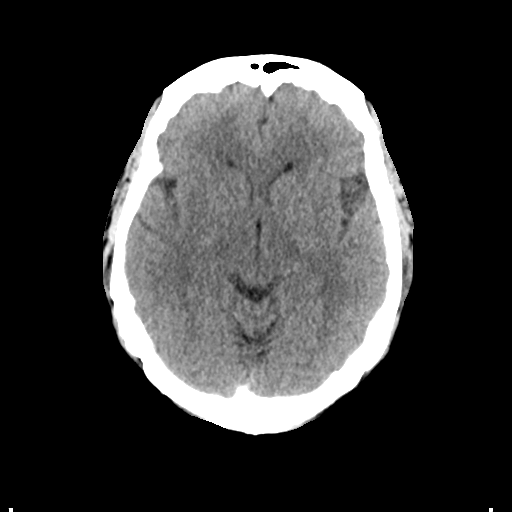
[im 16/31  brain]
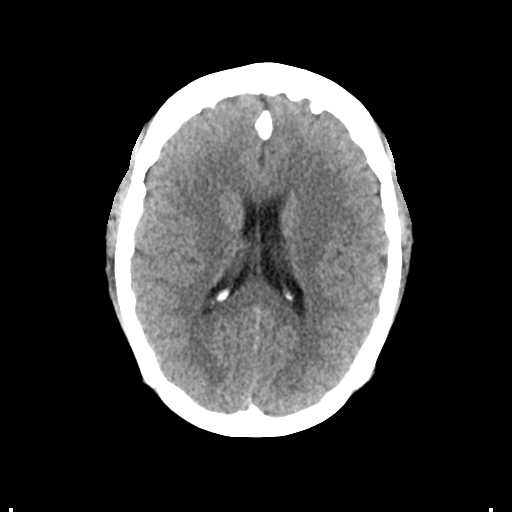
[im 16/31  bone]
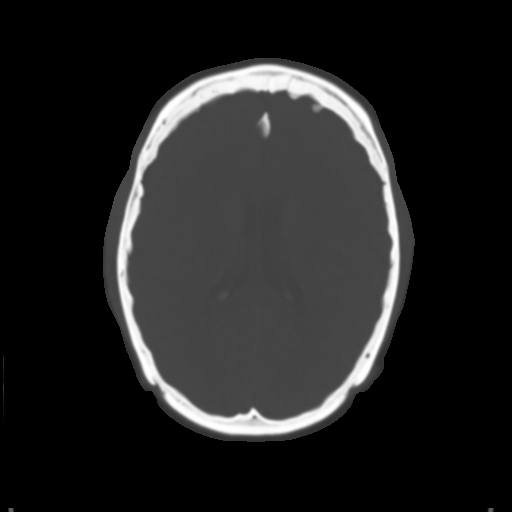
[im 19/31  brain]
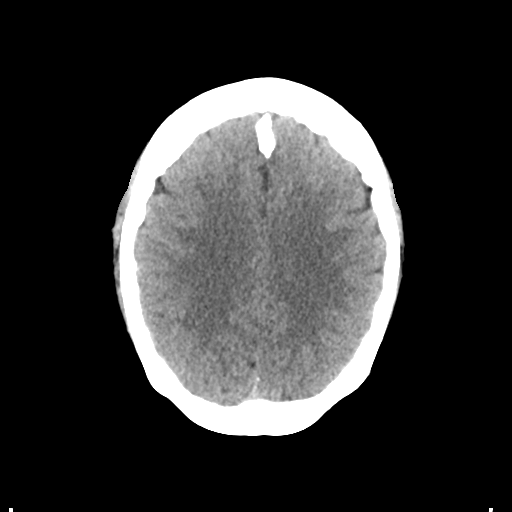
[im 22/31  brain]
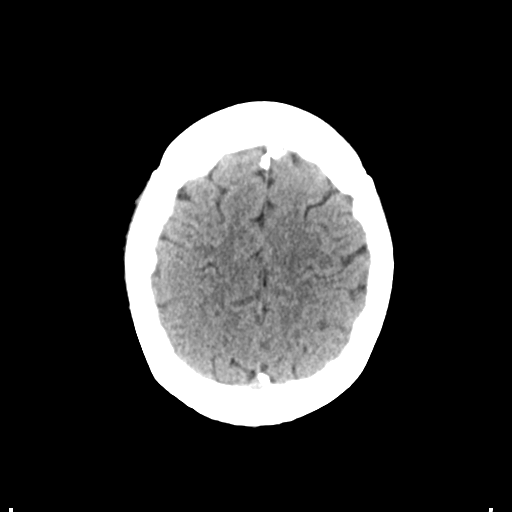
[im 25/31  brain]
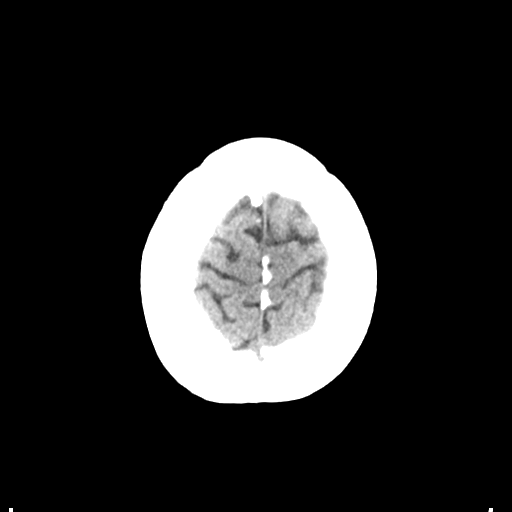
[im 28/31  brain]
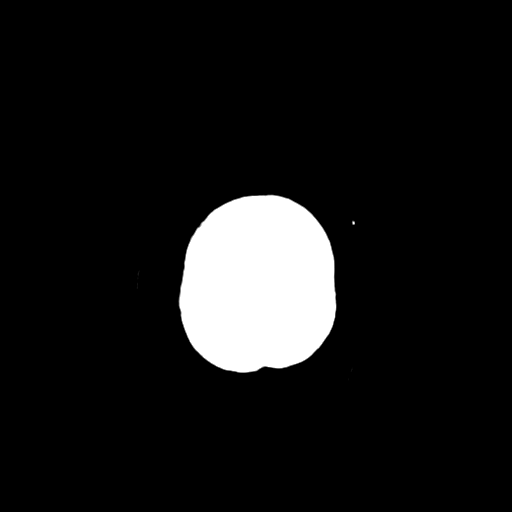
[im 28/31  bone]
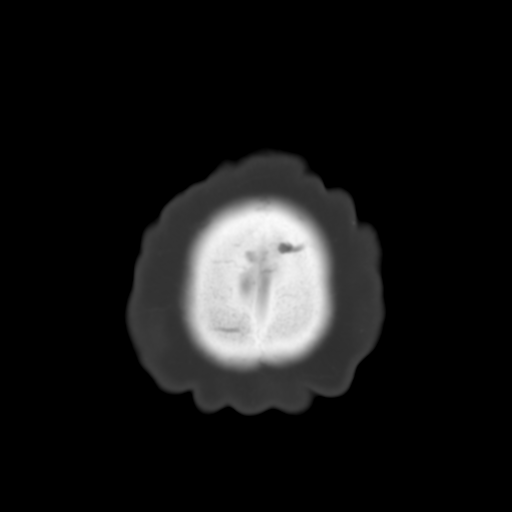

[Series 4: coronal soft tissue · coronal · 0.32mm/px · 3 of 67 slices shown]
[im 23/67  brain]
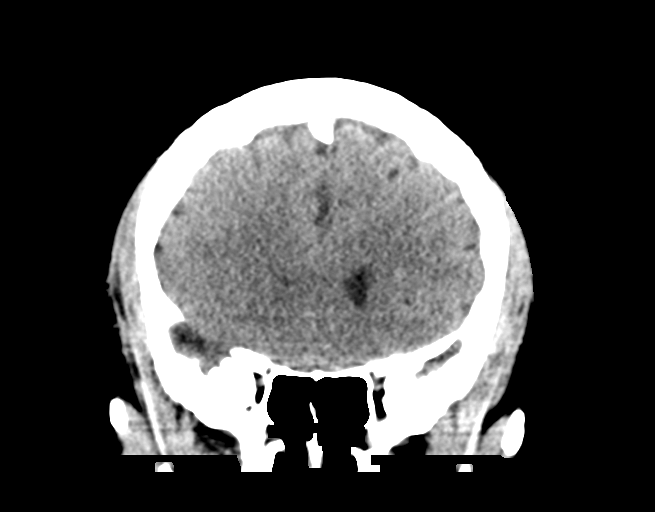
[im 30/67  brain]
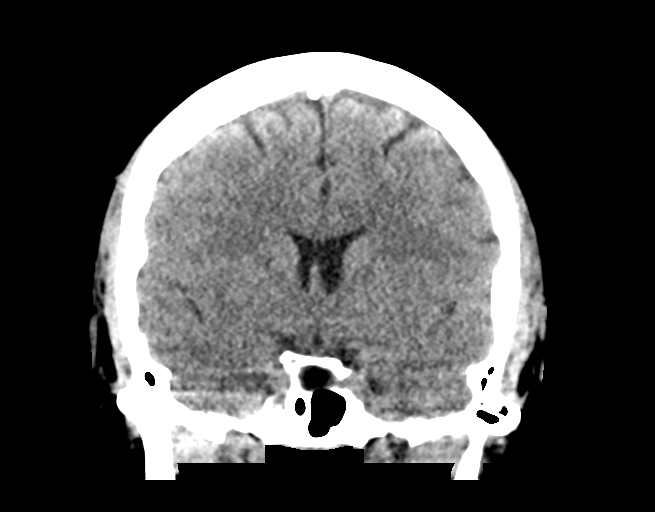
[im 37/67  brain]
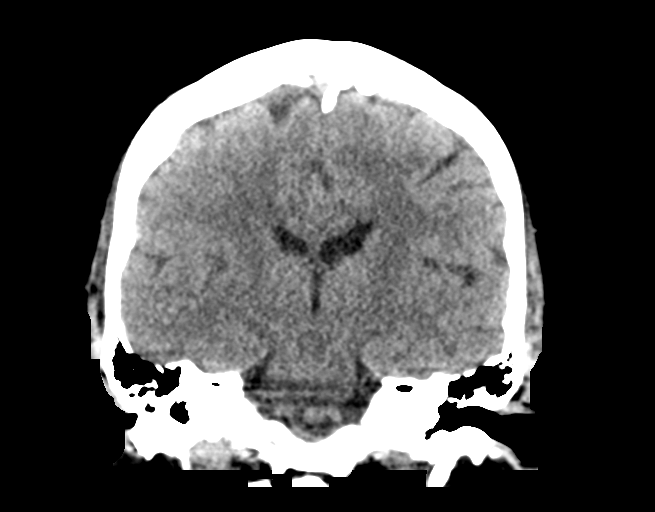

[Series 5: sagittal soft tissue · sagittal · 0.32mm/px · 3 of 67 slices shown]
[im 23/67  brain]
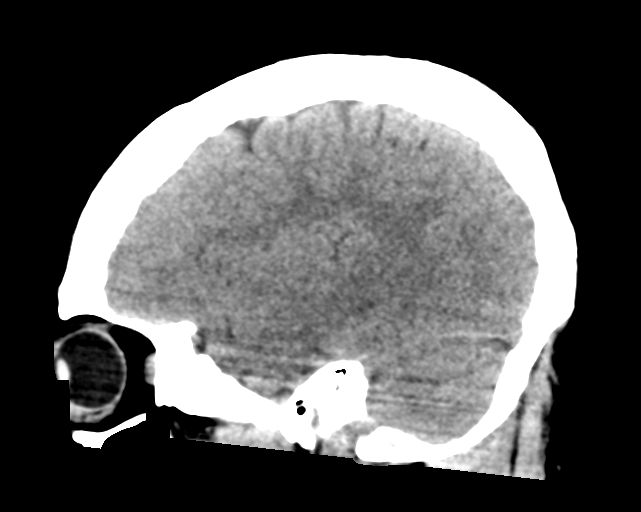
[im 34/67  brain]
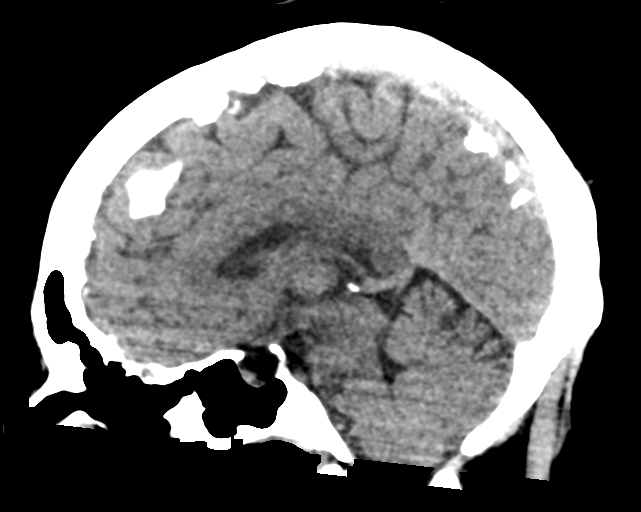
[im 45/67  brain]
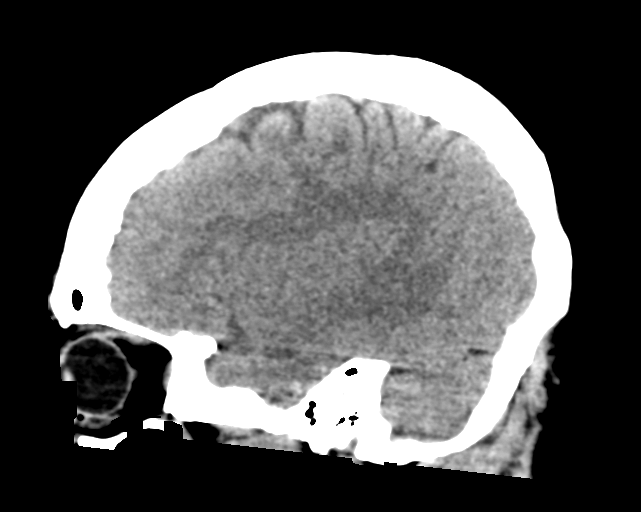

[15 of 47 positions shown; findings below may reference images not displayed]

FINDINGS: Brain: Ventricle size normal.  Negative for hemorrhage or mass.

Small chronic infarct left superior cerebellum. Additional small
hypodensities left cerebellum consistent with infarct of
indeterminate age.

Vascular: Negative for hyperdense vessel

Skull: Negative

Sinuses/Orbits: Negative

Other: None

ASPECTS ([place] Stroke Program Early CT Score)

- Ganglionic level infarction (caudate, lentiform nuclei, internal
capsule, insula, M1-M3 cortex): 7

- Supraganglionic infarction (M4-M6 cortex): 3

Total score (0-10 with 10 being normal): 10
IMPRESSION: 1. No acute intracranial abnormality
2. Small chronic appearing infarct left superior cerebellum.
Additional small areas of infarct in the left cerebellum of
indeterminate age.
3. ASPECTS is 10
4. These results were called by telephone at the time of
interpretation on 10/01/2018 at [DATE] to Dr. EVIE TOWER , who
verbally acknowledged these results.

## 2021-06-08 ENCOUNTER — Ambulatory Visit: Payer: Medicaid Other | Attending: Medical | Admitting: Physical Therapy

## 2021-06-08 ENCOUNTER — Other Ambulatory Visit: Payer: Self-pay

## 2021-06-08 DIAGNOSIS — M25612 Stiffness of left shoulder, not elsewhere classified: Secondary | ICD-10-CM | POA: Diagnosis present

## 2021-06-08 DIAGNOSIS — M25512 Pain in left shoulder: Secondary | ICD-10-CM | POA: Insufficient documentation

## 2021-06-08 DIAGNOSIS — M6281 Muscle weakness (generalized): Secondary | ICD-10-CM | POA: Diagnosis present

## 2021-06-08 NOTE — Therapy (Signed)
[facility] [place], [place], [address] Phone: [phone]   Fax:  [phone]  Physical Therapy Evaluation  Patient Details  Name: Kelly Torres MRN: [id number] Date of Birth: 04-12-1976 Referring Provider (PT): [facility] PA-C.   Encounter Date: 06/08/2021   PT End of Session - 06/08/21 1315     Visit Number 1    Number of Visits 12    Date for PT Re-Evaluation 07/20/21    PT Start Time 7564    PT Stop Time 1322    PT Time Calculation (min) 24 min    Activity Tolerance Patient tolerated treatment well    Behavior During Therapy WFL for tasks assessed/performed             Past Medical History:  Diagnosis Date   Anemia    Arthritis    back   Chronic headaches    Constipation - functional    DVT (deep venous thrombosis) (HCC)    GERD (gastroesophageal reflux disease)    Hernia, abdominal    Hx of migraines    last 08/2011   Hypercholesterolemia    Late effects of cerebrovascular disease 2016   Low back pain    Lumbar herniated disc    pain radiates down right leg   Menometrorrhagia    Sciatica of right side    Spinal headache    Stroke ([place]) 2015    Past Surgical History:  Procedure Laterality Date   ANTERIOR CRUCIATE LIGAMENT REPAIR  2001   right knee, Dr. Aline Brochure, [place]  2002, 2005   [place]   ENDOMETRIAL ABLATION     LUMBAR LAMINECTOMY/DECOMPRESSION MICRODISCECTOMY  01/10/2012   Procedure: LUMBAR LAMINECTOMY/DECOMPRESSION MICRODISCECTOMY 1 LEVEL;  Surgeon: Ophelia Charter, MD;  Location: [place] [facility];  Service: Neurosurgery;  Laterality: Right;  RIGHT Lumbar Four-Five Diskectomy   LUMBAR LAMINECTOMY/DECOMPRESSION MICRODISCECTOMY  06/18/2012   Procedure: LUMBAR LAMINECTOMY/DECOMPRESSION MICRODISCECTOMY 1 LEVEL;  Surgeon: Ophelia Charter, MD;  Location: [place] [facility];  Service: Neurosurgery;  Laterality: Right;  Redo Right Lumbar Four-Five Diskectomy   TONSILLECTOMY   age 75    There were no vitals filed for this visit.    Subjective Assessment - 06/08/21 1316     Subjective COVID-19 screen performed prior to patient entering clinic.  The patient presents to the clinic today with c/o left shoulder pain over the last two months.  She went through many radiation treatments which required she have her arms overhead for at least 15 minutes every session.  This and a lumpectomy and lymph node removal for the left axilla appeasr to have been the contributing factors to her shoulder pain.  She rates her pain at a 6/10 today but can reach severe levels with certain movments such as attempting to reach behind her back.  In fact, she can no longer fasten or unfasten her bra and her husband muct do so.  Medication and heat and cold help decrease pain.  Her sleep is disturbed due to pain.  She will experience left UE numbness as well.    Pertinent History Breast cancer (patient states she is considered "cancer-free" at this time, OA, h/o LBP, ACL reconstruction, lumbar surgery.    How long can you sit comfortably? Unlimited.    Patient Stated Goals Use left arm without pain.    Currently in Pain? Yes    Pain Score 6     Pain Location Shoulder    Pain Orientation  Left    Pain Descriptors / Indicators Stabbing;Throbbing    Pain Type Acute pain    Pain Radiating Towards Left UE.    Pain Onset More than a month ago    Pain Frequency Constant    Aggravating Factors  Certain left shoulder movements.    Pain Relieving Factors See above.                [facility] PT Assessment - 06/08/21 0001       Assessment   Medical Diagnosis Chronic left shoulder pain.    Referring Provider (PT) [facility] PA-C.    Onset Date/Surgical Date --   ~2 months.   Hand Dominance Left      Restrictions   Weight Bearing Restrictions No      Balance Screen   Has the patient fallen in the past 6 months No    Has the patient had a decrease in activity level because of a fear  of falling?  No    Is the patient reluctant to leave their home because of a fear of falling?  No      Home Environment   Living Environment Private residence      Prior Function   Level of Independence Independent      Posture/Postural Control   Posture/Postural Control Postural limitations    Postural Limitations Rounded Shoulders;Forward head      Deep Tendon Reflexes   DTR Assessment Site Biceps;Brachioradialis;Triceps    Biceps DTR --   Left absent, right 1+/4+.   Brachioradialis DTR --   Left absent, right 1+/4+.   Triceps DTR --   Left absent, right 1+/4+.     ROM / Strength   AROM / PROM / Strength AROM;Strength      AROM   Overall AROM Comments Active antigravity left shoulder flexion to 90 degrees and passive to 150 degrees motion appears limited due to pain.  Full active ER and behind back to left SIJ.      Strength   Overall Strength Comments Left shoulder flexion is 4-/4, abduction to 3+ to 4-/5, IR/ER is 4+/5.      Palpation   Palpation comment Tender to palpation with an active trigger point over mid-point of patient's left UT.  She is tender to palpation over her left posterior cuff region and bicipital tendon.      Special Tests   Other special tests (-) Left Drop Arm test. (+) Speeds and Impingement testing.      Ambulation/Gait   Gait Comments WNL.                        Objective measurements completed on examination: See above findings.                     PT Long Term Goals - 06/08/21 1340       PT LONG TERM GOAL #1   Title Independent with a HEP.    Baseline No knowledge of appropriate ther ex.    Time 6    Period Weeks    Status New      PT LONG TERM GOAL #2   Title Patient able to reach behind back with left hand to fasten/unfasten bar.    Baseline Unable to perform.    Time 6    Period Weeks    Status New      PT LONG TERM GOAL #3   Title Increase left  shoulder strength to a solid 5/5 to increase  stability for performance of functional activities.    Baseline Left shoulder weak especially into flexion and abduction (3+ to 4-/5).    Time 6    Period Weeks    Status New      PT LONG TERM GOAL #4   Title Perform ADLs with pain not > 3/10.    Baseline Perform ADL's and certain left shoulder motions can can pain-levels to reach near severe levels.    Time 6    Period Weeks    Status New                    Plan - 06/08/21 1332     Clinical Impression Statement The patient presents to OPPT with c/o left shoulder pain over the last two months related to multiple radaition treatments in which she had to keep her arms overhead for extended periods of time.  She is lacking active left shoulder flexion and cannot reach behind her back.  She has some loss of strength which appears related to pain.  She has palpable tenderness over her left UT, posterior cuff and bicipital tendon.  She demonstrates a positive left shoulder Speeds test and Impigement test.  Her pain is disturbing her sleep and impairing her ability to perform ADL's.  Patient will benefit from skilled physical therapy intervention to address pain and deficits.    Personal Factors and Comorbidities Other;Comorbidity 1    Comorbidities Breast cancer (patient states she is considered "cancer-free" at this time, OA, h/o LBP, ACL reconstruction, lumbar surgery.    Examination-Activity Limitations Other;Dressing    Examination-Participation Restrictions Other    Stability/Clinical Decision Making Stable/Uncomplicated    Clinical Decision Making Low    Rehab Potential Excellent    PT Frequency 2x / week    PT Duration 6 weeks    PT Treatment/Interventions ADLs/Self Care Home Management;Manual techniques;Therapeutic exercise;Therapeutic activities    PT Next Visit Plan UBE, supine cane exercises, UE Ranger, RW4, AA and AROM.  STW/M.    Consulted and Agree with Plan of Care Patient             Patient will benefit from  skilled therapeutic intervention in order to improve the following deficits and impairments:  Pain, Decreased activity tolerance, Decreased strength, Decreased range of motion, Increased muscle spasms  Visit Diagnosis: Acute pain of left shoulder - Plan: PT plan of care cert/re-cert  Stiffness of left shoulder, not elsewhere classified - Plan: PT plan of care cert/re-cert  Muscle weakness (generalized) - Plan: PT plan of care cert/re-cert     Problem List Patient Active Problem List   Diagnosis Date Noted   Acute CVA (cerebrovascular accident) ([place]) 10/02/2018   Left arm numbness 10/01/2018   Left arm weakness 10/01/2018   TIA (transient ischemic attack) 10/01/2018   Nonadherence to medication    History of deep vein thrombosis (DVT) of lower extremity    History of cerebellar stroke    heavy periods 11/06/2011    Yuya Vanwingerden, [place], PT 06/08/2021, 2:06 PM  [facility] [address] [place], [place], [address] Phone: [phone]   Fax:  [phone]  Name: ZLATA ALCAIDE MRN: [id number] Date of Birth: 11-11-1975

## 2021-06-27 ENCOUNTER — Other Ambulatory Visit: Payer: Self-pay

## 2021-06-27 ENCOUNTER — Ambulatory Visit: Payer: Medicaid Other | Attending: Medical | Admitting: *Deleted

## 2021-06-27 DIAGNOSIS — M25612 Stiffness of left shoulder, not elsewhere classified: Secondary | ICD-10-CM | POA: Diagnosis present

## 2021-06-27 DIAGNOSIS — M6281 Muscle weakness (generalized): Secondary | ICD-10-CM | POA: Diagnosis present

## 2021-06-27 DIAGNOSIS — M25512 Pain in left shoulder: Secondary | ICD-10-CM | POA: Diagnosis present

## 2021-06-27 NOTE — Therapy (Signed)
[facility] [place], [place], [address] Phone: [phone]   Fax:  [phone]  Physical Therapy Treatment  Patient Details  Name: HAIZLEY CANNELLA MRN: [id number] Date of Birth: 11-06-1975 Referring Provider (PT): [facility] PA-C.   Encounter Date: 06/27/2021   PT End of Session - 06/27/21 1542     Visit Number 2    Number of Visits 12    Date for PT Re-Evaluation 07/20/21    PT Start Time 1432    PT Stop Time 1520    PT Time Calculation (min) 48 min             Past Medical History:  Diagnosis Date   Anemia    Arthritis    back   Chronic headaches    Constipation - functional    DVT (deep venous thrombosis) (HCC)    GERD (gastroesophageal reflux disease)    Hernia, abdominal    Hx of migraines    last 08/2011   Hypercholesterolemia    Late effects of cerebrovascular disease 2016   Low back pain    Lumbar herniated disc    pain radiates down right leg   Menometrorrhagia    Sciatica of right side    Spinal headache    Stroke ([place]) 2015    Past Surgical History:  Procedure Laterality Date   ANTERIOR CRUCIATE LIGAMENT REPAIR  2001   right knee, Dr. Aline Brochure, [place]  2002, 2005   [place]   ENDOMETRIAL ABLATION     LUMBAR LAMINECTOMY/DECOMPRESSION MICRODISCECTOMY  01/10/2012   Procedure: LUMBAR LAMINECTOMY/DECOMPRESSION MICRODISCECTOMY 1 LEVEL;  Surgeon: Ophelia Charter, MD;  Location: [place] [facility];  Service: Neurosurgery;  Laterality: Right;  RIGHT Lumbar Four-Five Diskectomy   LUMBAR LAMINECTOMY/DECOMPRESSION MICRODISCECTOMY  06/18/2012   Procedure: LUMBAR LAMINECTOMY/DECOMPRESSION MICRODISCECTOMY 1 LEVEL;  Surgeon: Ophelia Charter, MD;  Location: [place] [facility];  Service: Neurosurgery;  Laterality: Right;  Redo Right Lumbar Four-Five Diskectomy   TONSILLECTOMY  age 60    There were no vitals filed for this visit.   Subjective Assessment - 06/27/21 1443     Subjective LT  shldr is sore today    Pertinent History Breast cancer (patient states she is considered "cancer-free" at this time, OA, h/o LBP, ACL reconstruction, lumbar surgery.    How long can you sit comfortably? Unlimited.    Patient Stated Goals Use left arm without pain.    Currently in Pain? Yes    Pain Score 6     Pain Location Shoulder    Pain Orientation Left    Pain Descriptors / Indicators Sore    Pain Onset More than a month ago                               [facility] Adult PT Treatment/Exercise - 06/27/21 0001       Exercises   Exercises Shoulder      Shoulder Exercises: Standing   External Rotation Left;Strengthening;Theraband;10 reps;20 reps   3x10-20   Theraband Level (Shoulder External Rotation) Level 1 (Yellow)    Internal Rotation Strengthening;Left;Theraband;10 reps;20 reps   3x10-20 reps   Theraband Level (Shoulder Internal Rotation) Level 1 (Yellow)      Shoulder Exercises: ROM/Strengthening   UBE (Upper Arm Bike) 120 x 6 mins      Shoulder Exercises: Stretch   Internal Rotation Stretch 5 reps    Internal Rotation  Stretch Limitations HBB    Other Shoulder Stretches posture stretch for anterior tissues with shldr blade squeeze      Manual Therapy   Manual Therapy Passive ROM;Myofascial release;Soft tissue mobilization    Soft tissue mobilization STW to anterior shldr and pec major.    Passive ROM Manual PROM LT shldr for elevation, ER, and IR with end-range holds                          PT Long Term Goals - 06/08/21 1340       PT LONG TERM GOAL #1   Title Independent with a HEP.    Baseline No knowledge of appropriate ther ex.    Time 6    Period Weeks    Status New      PT LONG TERM GOAL #2   Title Patient able to reach behind back with left hand to fasten/unfasten bar.    Baseline Unable to perform.    Time 6    Period Weeks    Status New      PT LONG TERM GOAL #3   Title Increase left shoulder strength to a solid  5/5 to increase stability for performance of functional activities.    Baseline Left shoulder weak especially into flexion and abduction (3+ to 4-/5).    Time 6    Period Weeks    Status New      PT LONG TERM GOAL #4   Title Perform ADLs with pain not > 3/10.    Baseline Perform ADL's and certain left shoulder motions can can pain-levels to reach near severe levels.    Time 6    Period Weeks    Status New                   Plan - 06/27/21 1543     Clinical Impression Statement Pt arrived today doing fair, but with stinging / burning in LT shldr/ upper arm.  Rx focused on light RTC strengthening as well as ROM/ stretching to LT shldr for ER,IR, and elevation. HEP given for tband ER/IR and HBB and posture stretching    Personal Factors and Comorbidities Other;Comorbidity 1    Comorbidities Breast cancer (patient states she is considered "cancer-free" at this time, OA, h/o LBP, ACL reconstruction, lumbar surgery.    Examination-Activity Limitations Other;Dressing    Examination-Participation Restrictions Other    Stability/Clinical Decision Making Stable/Uncomplicated    Rehab Potential Excellent    PT Frequency 2x / week    PT Duration 6 weeks    PT Treatment/Interventions ADLs/Self Care Home Management;Manual techniques;Therapeutic exercise;Therapeutic activities    PT Next Visit Plan UBE, supine cane exercises, UE Ranger, RW4, AA and AROM.  STW/M.    Consulted and Agree with Plan of Care Patient             Patient will benefit from skilled therapeutic intervention in order to improve the following deficits and impairments:  Pain, Decreased activity tolerance, Decreased strength, Decreased range of motion, Increased muscle spasms  Visit Diagnosis: Stiffness of left shoulder, not elsewhere classified  Acute pain of left shoulder  Muscle weakness (generalized)     Problem List Patient Active Problem List   Diagnosis Date Noted   Acute CVA (cerebrovascular  accident) ([place]) 10/02/2018   Left arm numbness 10/01/2018   Left arm weakness 10/01/2018   TIA (transient ischemic attack) 10/01/2018   Nonadherence to medication    History  of deep vein thrombosis (DVT) of lower extremity    History of cerebellar stroke    heavy periods 11/06/2011    Roslind Michaux,CHRIS, PTA 06/27/2021, 3:49 PM  [facility] [address] [place], [place], [address] Phone: [phone]   Fax:  [phone]  Name: DAYLENE VANDENBOSCH MRN: [id number] Date of Birth: 10/18/1975

## 2021-06-29 ENCOUNTER — Other Ambulatory Visit: Payer: Self-pay

## 2021-06-29 ENCOUNTER — Ambulatory Visit: Payer: Medicaid Other | Admitting: *Deleted

## 2021-06-29 DIAGNOSIS — M25512 Pain in left shoulder: Secondary | ICD-10-CM

## 2021-06-29 DIAGNOSIS — M25612 Stiffness of left shoulder, not elsewhere classified: Secondary | ICD-10-CM

## 2021-06-29 DIAGNOSIS — M6281 Muscle weakness (generalized): Secondary | ICD-10-CM

## 2021-06-29 NOTE — Therapy (Signed)
[facility] [place], [place], [address] Phone: [phone]   Fax:  [phone]  Physical Therapy Treatment  Patient Details  Name: Kelly Torres MRN: [id number] Date of Birth: June 06, 1976 Referring Provider (PT): [facility] PA-C.   Encounter Date: 06/29/2021   PT End of Session - 06/29/21 1430     Visit Number 3    Number of Visits 12    Date for PT Re-Evaluation 07/20/21    PT Start Time 6761    PT Stop Time 1450    PT Time Calculation (min) 35 min             Past Medical History:  Diagnosis Date   Anemia    Arthritis    back   Chronic headaches    Constipation - functional    DVT (deep venous thrombosis) (HCC)    GERD (gastroesophageal reflux disease)    Hernia, abdominal    Hx of migraines    last 08/2011   Hypercholesterolemia    Late effects of cerebrovascular disease 2016   Low back pain    Lumbar herniated disc    pain radiates down right leg   Menometrorrhagia    Sciatica of right side    Spinal headache    Stroke ([place]) 2015    Past Surgical History:  Procedure Laterality Date   ANTERIOR CRUCIATE LIGAMENT REPAIR  2001   right knee, Dr. Aline Brochure, [place]  2002, 2005   [place]   ENDOMETRIAL ABLATION     LUMBAR LAMINECTOMY/DECOMPRESSION MICRODISCECTOMY  01/10/2012   Procedure: LUMBAR LAMINECTOMY/DECOMPRESSION MICRODISCECTOMY 1 LEVEL;  Surgeon: Ophelia Charter, MD;  Location: [place] [facility];  Service: Neurosurgery;  Laterality: Right;  RIGHT Lumbar Four-Five Diskectomy   LUMBAR LAMINECTOMY/DECOMPRESSION MICRODISCECTOMY  06/18/2012   Procedure: LUMBAR LAMINECTOMY/DECOMPRESSION MICRODISCECTOMY 1 LEVEL;  Surgeon: Ophelia Charter, MD;  Location: [place] [facility];  Service: Neurosurgery;  Laterality: Right;  Redo Right Lumbar Four-Five Diskectomy   TONSILLECTOMY  age 46    There were no vitals filed for this visit.   Subjective Assessment - 06/29/21 1427     Subjective LT  shldr is sore today. 2-3/10, 7/10 with stretching    Pertinent History Breast cancer (patient states she is considered "cancer-free" at this time, OA, h/o LBP, ACL reconstruction, lumbar surgery.    How long can you sit comfortably? Unlimited.    Patient Stated Goals Use left arm without pain.    Currently in Pain? Yes    Pain Score 3     Pain Location Shoulder    Pain Orientation Left    Pain Descriptors / Indicators Sore    Pain Type Acute pain    Pain Onset More than a month ago                               [facility] Adult PT Treatment/Exercise - 06/29/21 0001       Exercises   Exercises Shoulder      Shoulder Exercises: Pulleys   Flexion 5 minutes   LT shldr     Shoulder Exercises: ROM/Strengthening   UBE (Upper Arm Bike) 120 x 8 mins      Shoulder Exercises: Stretch   Corner Stretch 5 reps;20 seconds   Door way stretch   Internal Rotation Stretch 5 reps    Internal Rotation Stretch Limitations HBB    Other Shoulder Stretches posture stretch  for anterior tissues with shldr blade squeeze      Manual Therapy   Manual Therapy Passive ROM;Myofascial release;Soft tissue mobilization    Soft tissue mobilization STW to anterior shldr and pec major.    Passive ROM Manual PROM LT shldr with focus on ER with end-range holds                          PT Long Term Goals - 06/08/21 1340       PT LONG TERM GOAL #1   Title Independent with a HEP.    Baseline No knowledge of appropriate ther ex.    Time 6    Period Weeks    Status New      PT LONG TERM GOAL #2   Title Patient able to reach behind back with left hand to fasten/unfasten bar.    Baseline Unable to perform.    Time 6    Period Weeks    Status New      PT LONG TERM GOAL #3   Title Increase left shoulder strength to a solid 5/5 to increase stability for performance of functional activities.    Baseline Left shoulder weak especially into flexion and abduction (3+ to 4-/5).     Time 6    Period Weeks    Status New      PT LONG TERM GOAL #4   Title Perform ADLs with pain not > 3/10.    Baseline Perform ADL's and certain left shoulder motions can can pain-levels to reach near severe levels.    Time 6    Period Weeks    Status New                   Plan - 06/29/21 1457     Clinical Impression Statement Pt arrived today reporting needing to leave early and that  her shldr was a little sore after last Rx.Rx focused on increasing LT shldr ROM for flexion, ER , and IR HBB.Pt reports feeling decreased tightness end of session    Personal Factors and Comorbidities Other;Comorbidity 1    Comorbidities Breast cancer (patient states she is considered "cancer-free" at this time, OA, h/o LBP, ACL reconstruction, lumbar surgery.    Examination-Participation Restrictions Other    Stability/Clinical Decision Making Stable/Uncomplicated    Rehab Potential Excellent    PT Frequency 2x / week    PT Duration 6 weeks    PT Treatment/Interventions ADLs/Self Care Home Management;Manual techniques;Therapeutic exercise;Therapeutic activities    PT Next Visit Plan UBE, supine cane exercises, UE Ranger, RW4, AA and AROM.  STW/M.    Consulted and Agree with Plan of Care Patient             Patient will benefit from skilled therapeutic intervention in order to improve the following deficits and impairments:  Pain, Decreased activity tolerance, Decreased strength, Decreased range of motion, Increased muscle spasms  Visit Diagnosis: Stiffness of left shoulder, not elsewhere classified  Acute pain of left shoulder  Muscle weakness (generalized)     Problem List Patient Active Problem List   Diagnosis Date Noted   Acute CVA (cerebrovascular accident) ([place]) 10/02/2018   Left arm numbness 10/01/2018   Left arm weakness 10/01/2018   TIA (transient ischemic attack) 10/01/2018   Nonadherence to medication    History of deep vein thrombosis (DVT) of lower extremity     History of cerebellar stroke    heavy periods 11/06/2011  Pesach Frisch,CHRIS, PTA 06/29/2021, 3:06 PM  [facility] [address] [place], [place], [address] Phone: [phone]   Fax:  [phone]  Name: Kelly Torres MRN: [id number] Date of Birth: December 01, 1975

## 2021-07-06 ENCOUNTER — Ambulatory Visit: Payer: Medicaid Other

## 2021-07-11 ENCOUNTER — Ambulatory Visit: Payer: Medicaid Other

## 2021-07-11 ENCOUNTER — Other Ambulatory Visit: Payer: Self-pay

## 2021-07-11 DIAGNOSIS — M6281 Muscle weakness (generalized): Secondary | ICD-10-CM

## 2021-07-11 DIAGNOSIS — M25612 Stiffness of left shoulder, not elsewhere classified: Secondary | ICD-10-CM | POA: Diagnosis not present

## 2021-07-11 DIAGNOSIS — M25512 Pain in left shoulder: Secondary | ICD-10-CM

## 2021-07-11 NOTE — Therapy (Signed)
[facility] [place], [place], [address] Phone: [phone]   Fax:  [phone]  Physical Therapy Treatment  Patient Details  Name: Kelly Torres MRN: [id number] Date of Birth: Aug 11, 1975 Referring Provider (PT): [facility] PA-C.   Encounter Date: 07/11/2021   PT End of Session - 07/11/21 1434     Visit Number 4    Number of Visits 12    Date for PT Re-Evaluation 07/20/21    PT Start Time 1430    PT Stop Time 3419   Patient reported that she needed to leave early.   PT Time Calculation (min) 35 min    Activity Tolerance Patient tolerated treatment well    Behavior During Therapy WFL for tasks assessed/performed             Past Medical History:  Diagnosis Date   Anemia    Arthritis    back   Chronic headaches    Constipation - functional    DVT (deep venous thrombosis) (HCC)    GERD (gastroesophageal reflux disease)    Hernia, abdominal    Hx of migraines    last 08/2011   Hypercholesterolemia    Late effects of cerebrovascular disease 2016   Low back pain    Lumbar herniated disc    pain radiates down right leg   Menometrorrhagia    Sciatica of right side    Spinal headache    Stroke ([place]) 2015    Past Surgical History:  Procedure Laterality Date   ANTERIOR CRUCIATE LIGAMENT REPAIR  2001   right knee, Dr. Aline Brochure, [place]  2002, 2005   [place]   ENDOMETRIAL ABLATION     LUMBAR LAMINECTOMY/DECOMPRESSION MICRODISCECTOMY  01/10/2012   Procedure: LUMBAR LAMINECTOMY/DECOMPRESSION MICRODISCECTOMY 1 LEVEL;  Surgeon: Ophelia Charter, MD;  Location: [place] NEURO ORS;  Service: Neurosurgery;  Laterality: Right;  RIGHT Lumbar Four-Five Diskectomy   LUMBAR LAMINECTOMY/DECOMPRESSION MICRODISCECTOMY  06/18/2012   Procedure: LUMBAR LAMINECTOMY/DECOMPRESSION MICRODISCECTOMY 1 LEVEL;  Surgeon: Ophelia Charter, MD;  Location: [place] NEUR[facility] ORS;  Service: Neurosurgery;  Laterality: Right;  Redo  Right Lumbar Four-Five Diskectomy   TONSILLECTOMY  age 46    There were no vitals filed for this visit.   Subjective Assessment - 07/11/21 1432     Subjective Patient reports that her shoulder is tight today, but it is not hurting like it has been.    Pertinent History Breast cancer (patient states she is considered "cancer-free" at this time, OA, h/o LBP, ACL reconstruction, lumbar surgery.    How long can you sit comfortably? Unlimited.    Patient Stated Goals Use left arm without pain.    Currently in Pain? Yes    Pain Score 6     Pain Location Shoulder    Pain Orientation Left    Pain Descriptors / Indicators Tightness    Pain Onset More than a month ago                               [facility] Adult PT Treatment/Exercise - 07/11/21 0001       Shoulder Exercises: Standing   Horizontal ABduction AROM;Both;15 reps    Extension Both   2 minutes   Other Standing Exercises Posterior lift off   green t-band; 3 minutes   Other Standing Exercises Therabar bending   LUE; shoulder at 90 degrees flexion; 2 minutes; green t-bar  Shoulder Exercises: ROM/Strengthening   UBE (Upper Arm Bike) 120 RPM x 10 minutes (forward and backward)      Shoulder Exercises: Stretch   Other Shoulder Stretches Doorway stretch   4 reps of 30 seconds each                         PT Long Term Goals - 06/08/21 1340       PT LONG TERM GOAL #1   Title Independent with a HEP.    Baseline No knowledge of appropriate ther ex.    Time 6    Period Weeks    Status New      PT LONG TERM GOAL #2   Title Patient able to reach behind back with left hand to fasten/unfasten bar.    Baseline Unable to perform.    Time 6    Period Weeks    Status New      PT LONG TERM GOAL #3   Title Increase left shoulder strength to a solid 5/5 to increase stability for performance of functional activities.    Baseline Left shoulder weak especially into flexion and abduction (3+ to 4-/5).     Time 6    Period Weeks    Status New      PT LONG TERM GOAL #4   Title Perform ADLs with pain not > 3/10.    Baseline Perform ADL's and certain left shoulder motions can can pain-levels to reach near severe levels.    Time 6    Period Weeks    Status New                   Plan - 07/11/21 1445     Clinical Impression Statement Patient was progressed with multiple new and familiar interventions for improved shoulder mobility, strength, and stability. She required minimal cuing with today's new interventions for proper positioning to facilitate improved soft tissue extensibility. The UBE was the most effective at reducing her familiar symptoms today. She reported feeling better upon the conclusion of treatment as her shoulder was not as tight. She continues to require skilled physical therapy to address her remaining impairments to return to her prior level of function.    Personal Factors and Comorbidities Other;Comorbidity 1    Comorbidities Breast cancer (patient states she is considered "cancer-free" at this time, OA, h/o LBP, ACL reconstruction, lumbar surgery.    Examination-Participation Restrictions Other    Stability/Clinical Decision Making Stable/Uncomplicated    Rehab Potential Excellent    PT Frequency 2x / week    PT Duration 6 weeks    PT Treatment/Interventions ADLs/Self Care Home Management;Manual techniques;Therapeutic exercise;Therapeutic activities    PT Next Visit Plan UBE, supine cane exercises, UE Ranger, RW4, AA and AROM.  STW/M.    Consulted and Agree with Plan of Care Patient             Patient will benefit from skilled therapeutic intervention in order to improve the following deficits and impairments:  Pain, Decreased activity tolerance, Decreased strength, Decreased range of motion, Increased muscle spasms  Visit Diagnosis: Stiffness of left shoulder, not elsewhere classified  Acute pain of left shoulder  Muscle weakness  (generalized)     Problem List Patient Active Problem List   Diagnosis Date Noted   Acute CVA (cerebrovascular accident) ([place]) 10/02/2018   Left arm numbness 10/01/2018   Left arm weakness 10/01/2018   TIA (transient ischemic attack) 10/01/2018   Nonadherence  to medication    History of deep vein thrombosis (DVT) of lower extremity    History of cerebellar stroke    heavy periods 11/06/2011    Darlin Coco, PT 07/11/2021, 3:12 PM  [facility] [place], [place], [address] Phone: [phone]   Fax:  [phone]  Name: Kelly Torres MRN: [id number] Date of Birth: June 17, 1975

## 2021-07-13 ENCOUNTER — Ambulatory Visit: Payer: Medicaid Other | Attending: Medical | Admitting: Physical Therapy

## 2021-07-13 DIAGNOSIS — M25612 Stiffness of left shoulder, not elsewhere classified: Secondary | ICD-10-CM | POA: Diagnosis not present

## 2021-07-13 DIAGNOSIS — M6281 Muscle weakness (generalized): Secondary | ICD-10-CM | POA: Diagnosis present

## 2021-07-13 DIAGNOSIS — M25512 Pain in left shoulder: Secondary | ICD-10-CM | POA: Insufficient documentation

## 2021-07-13 NOTE — Therapy (Signed)
[facility] [place], [place], [address] Phone: [phone]   Fax:  [phone]  Physical Therapy Treatment  Patient Details  Name: Kelly Torres MRN: [id number] Date of Birth: 07-Sep-1975 Referring Provider (PT): [facility] PA-C.   Encounter Date: 07/13/2021   PT End of Session - 07/13/21 1734     Visit Number 5    Number of Visits 12    Date for PT Re-Evaluation 07/20/21    PT Start Time 0400    PT Stop Time 0443    PT Time Calculation (min) 43 min    Activity Tolerance Patient tolerated treatment well    Behavior During Therapy WFL for tasks assessed/performed             Past Medical History:  Diagnosis Date   Anemia    Arthritis    back   Chronic headaches    Constipation - functional    DVT (deep venous thrombosis) (HCC)    GERD (gastroesophageal reflux disease)    Hernia, abdominal    Hx of migraines    last 08/2011   Hypercholesterolemia    Late effects of cerebrovascular disease 2016   Low back pain    Lumbar herniated disc    pain radiates down right leg   Menometrorrhagia    Sciatica of right side    Spinal headache    Stroke ([place]) 2015    Past Surgical History:  Procedure Laterality Date   ANTERIOR CRUCIATE LIGAMENT REPAIR  2001   right knee, Dr. Aline Brochure, [place]  2002, 2005   [place]   ENDOMETRIAL ABLATION     LUMBAR LAMINECTOMY/DECOMPRESSION MICRODISCECTOMY  01/10/2012   Procedure: LUMBAR LAMINECTOMY/DECOMPRESSION MICRODISCECTOMY 1 LEVEL;  Surgeon: Ophelia Charter, MD;  Location: [place] NEURO ORS;  Service: Neurosurgery;  Laterality: Right;  RIGHT Lumbar Four-Five Diskectomy   LUMBAR LAMINECTOMY/DECOMPRESSION MICRODISCECTOMY  06/18/2012   Procedure: LUMBAR LAMINECTOMY/DECOMPRESSION MICRODISCECTOMY 1 LEVEL;  Surgeon: Ophelia Charter, MD;  Location: [place] NEURO ORS;  Service: Neurosurgery;  Laterality: Right;  Redo Right Lumbar Four-Five Diskectomy   TONSILLECTOMY  age  46    There were no vitals filed for this visit.   Subjective Assessment - 07/13/21 1734     Subjective COVID-19 screen performed prior to patient entering clinic.  Doing okay.    Pertinent History Breast cancer (patient states she is considered "cancer-free" at this time, OA, h/o LBP, ACL reconstruction, lumbar surgery.    How long can you sit comfortably? Unlimited.    Patient Stated Goals Use left arm without pain.    Currently in Pain? Yes    Pain Score 6     Pain Location Shoulder    Pain Orientation Left    Pain Descriptors / Indicators Tightness    Pain Type Acute pain    Pain Onset More than a month ago                               [facility] Adult PT Treatment/Exercise - 07/13/21 0001       Shoulder Exercises: Pulleys   Flexion 5 minutes    Other Pulley Exercises Wall ladder x 3 minutes f/b UE Ranger on wall x 3 minutes.      Shoulder Exercises: ROM/Strengthening   UBE (Upper Arm Bike) 120 RPM's x 10 minutes.      Manual Therapy   Manual Therapy Passive ROM  Passive ROM In supine:  PROM x 17 minutes to patient's left shoulder with LLLDS technique utilized.                          PT Long Term Goals - 06/08/21 1340       PT LONG TERM GOAL #1   Title Independent with a HEP.    Baseline No knowledge of appropriate ther ex.    Time 6    Period Weeks    Status New      PT LONG TERM GOAL #2   Title Patient able to reach behind back with left hand to fasten/unfasten bar.    Baseline Unable to perform.    Time 6    Period Weeks    Status New      PT LONG TERM GOAL #3   Title Increase left shoulder strength to a solid 5/5 to increase stability for performance of functional activities.    Baseline Left shoulder weak especially into flexion and abduction (3+ to 4-/5).    Time 6    Period Weeks    Status New      PT LONG TERM GOAL #4   Title Perform ADLs with pain not > 3/10.    Baseline Perform ADL's and certain left  shoulder motions can can pain-levels to reach near severe levels.    Time 6    Period Weeks    Status New                   Plan - 07/13/21 1744     Clinical Impression Statement The patient is doing well.  Good improvement in range of motion especially when moving behind back.    Personal Factors and Comorbidities Other;Comorbidity 1    Comorbidities Breast cancer (patient states she is considered "cancer-free" at this time, OA, h/o LBP, ACL reconstruction, lumbar surgery.    Examination-Activity Limitations Other;Dressing    Examination-Participation Restrictions Other    Stability/Clinical Decision Making Stable/Uncomplicated    Rehab Potential Excellent    PT Frequency 2x / week    PT Duration 6 weeks    PT Treatment/Interventions ADLs/Self Care Home Management;Manual techniques;Therapeutic exercise;Therapeutic activities    PT Next Visit Plan UBE, supine cane exercises, UE Ranger, RW4, AA and AROM.  STW/M.    Consulted and Agree with Plan of Care Patient             Patient will benefit from skilled therapeutic intervention in order to improve the following deficits and impairments:     Visit Diagnosis: Stiffness of left shoulder, not elsewhere classified  Acute pain of left shoulder  Muscle weakness (generalized)     Problem List Patient Active Problem List   Diagnosis Date Noted   Acute CVA (cerebrovascular accident) ([place]) 10/02/2018   Left arm numbness 10/01/2018   Left arm weakness 10/01/2018   TIA (transient ischemic attack) 10/01/2018   Nonadherence to medication    History of deep vein thrombosis (DVT) of lower extremity    History of cerebellar stroke    heavy periods 11/06/2011    Julyanna Scholle, [place], PT 07/13/2021, 5:46 PM  [facility] [address] [place], [place], [address] Phone: [phone]   Fax:  [phone]  Name: Kelly Torres MRN: [id number] Date of Birth: July 22, 1975

## 2021-07-18 ENCOUNTER — Ambulatory Visit: Payer: Medicaid Other | Admitting: Physical Therapy

## 2021-07-18 ENCOUNTER — Other Ambulatory Visit: Payer: Self-pay

## 2021-07-18 DIAGNOSIS — M25512 Pain in left shoulder: Secondary | ICD-10-CM

## 2021-07-18 DIAGNOSIS — M25612 Stiffness of left shoulder, not elsewhere classified: Secondary | ICD-10-CM | POA: Diagnosis not present

## 2021-07-18 DIAGNOSIS — M6281 Muscle weakness (generalized): Secondary | ICD-10-CM

## 2021-07-18 NOTE — Therapy (Signed)
[facility] [place], [place], [address] Phone: [phone]   Fax:  [phone]  Physical Therapy Treatment  Patient Details  Name: MARIELY MAHR MRN: [id number] Date of Birth: 05/08/1976 Referring Provider (PT): [facility] PA-C.   Encounter Date: 07/18/2021   PT End of Session - 07/18/21 1551     Visit Number 6    Number of Visits 12    Date for PT Re-Evaluation 07/20/21    PT Start Time 0230    PT Stop Time 0313    PT Time Calculation (min) 43 min    Activity Tolerance Patient tolerated treatment well    Behavior During Therapy [facility] for tasks assessed/performed             Past Medical History:  Diagnosis Date   Anemia    Arthritis    back   Chronic headaches    Constipation - functional    DVT (deep venous thrombosis) (HCC)    GERD (gastroesophageal reflux disease)    Hernia, abdominal    Hx of migraines    last 08/2011   Hypercholesterolemia    Late effects of cerebrovascular disease 2016   Low back pain    Lumbar herniated disc    pain radiates down right leg   Menometrorrhagia    Sciatica of right side    Spinal headache    Stroke ([place]) 2015    Past Surgical History:  Procedure Laterality Date   ANTERIOR CRUCIATE LIGAMENT REPAIR  2001   right knee, Dr. Aline Brochure, [place]  2002, 2005   [place]   ENDOMETRIAL ABLATION     LUMBAR LAMINECTOMY/DECOMPRESSION MICRODISCECTOMY  01/10/2012   Procedure: LUMBAR LAMINECTOMY/DECOMPRESSION MICRODISCECTOMY 1 LEVEL;  Surgeon: Ophelia Charter, MD;  Location: [place] NEURO ORS;  Service: Neurosurgery;  Laterality: Right;  RIGHT Lumbar Four-Five Diskectomy   LUMBAR LAMINECTOMY/DECOMPRESSION MICRODISCECTOMY  06/18/2012   Procedure: LUMBAR LAMINECTOMY/DECOMPRESSION MICRODISCECTOMY 1 LEVEL;  Surgeon: Ophelia Charter, MD;  Location: [place] NEURO ORS;  Service: Neurosurgery;  Laterality: Right;  Redo Right Lumbar Four-Five Diskectomy   TONSILLECTOMY  age  46    There were no vitals filed for this visit.   Subjective Assessment - 07/18/21 1549     Subjective COVID-19 screen performed prior to patient entering clinic.  No new complaints.    Pertinent History Breast cancer (patient states she is considered "cancer-free" at this time, OA, h/o LBP, ACL reconstruction, lumbar surgery.    How long can you sit comfortably? Unlimited.    Patient Stated Goals Use left arm without pain.    Currently in Pain? Yes    Pain Score 4     Pain Location Shoulder    Pain Orientation Left    Pain Descriptors / Indicators Tightness    Pain Type Acute pain                               [facility] Adult PT Treatment/Exercise - 07/18/21 0001       Exercises   Exercises Shoulder      Shoulder Exercises: Standing   Other Standing Exercises RW4 with red theraband to fatigue all directions.      Shoulder Exercises: Pulleys   Flexion 5 minutes    Other Pulley Exercises Wall ladder x 3 minutes f/b UE Ranger on the wall x 3 minutes.      Shoulder Exercises: ROM/Strengthening   UBE (  Upper Arm Bike) 120 RPM's x 10 minutes.      Manual Therapy   Manual Therapy Passive ROM    Passive ROM In supine:  PROM x 12 minutes to patient's left shoulder x 12 minutes.                          PT Long Term Goals - 06/08/21 1340       PT LONG TERM GOAL #1   Title Independent with a HEP.    Baseline No knowledge of appropriate ther ex.    Time 6    Period Weeks    Status New      PT LONG TERM GOAL #2   Title Patient able to reach behind back with left hand to fasten/unfasten bar.    Baseline Unable to perform.    Time 6    Period Weeks    Status New      PT LONG TERM GOAL #3   Title Increase left shoulder strength to a solid 5/5 to increase stability for performance of functional activities.    Baseline Left shoulder weak especially into flexion and abduction (3+ to 4-/5).    Time 6    Period Weeks    Status New      PT  LONG TERM GOAL #4   Title Perform ADLs with pain not > 3/10.    Baseline Perform ADL's and certain left shoulder motions can can pain-levels to reach near severe levels.    Time 6    Period Weeks    Status New                   Plan - 07/18/21 1552     Clinical Impression Statement Patient did very well today with progression to red theraband for RW4 exercises without complaint today.    Personal Factors and Comorbidities Other;Comorbidity 1    Comorbidities Breast cancer (patient states she is considered "cancer-free" at this time, OA, h/o LBP, ACL reconstruction, lumbar surgery.    Examination-Activity Limitations Other;Dressing    Examination-Participation Restrictions Other    Stability/Clinical Decision Making Stable/Uncomplicated    PT Frequency 2x / week    PT Duration 6 weeks    PT Next Visit Plan UBE, supine cane exercises, UE Ranger, RW4, AA and AROM.  STW/M.    Consulted and Agree with Plan of Care Patient             Patient will benefit from skilled therapeutic intervention in order to improve the following deficits and impairments:  Pain, Decreased activity tolerance, Decreased strength, Decreased range of motion, Increased muscle spasms  Visit Diagnosis: Stiffness of left shoulder, not elsewhere classified  Acute pain of left shoulder  Muscle weakness (generalized)     Problem List Patient Active Problem List   Diagnosis Date Noted   Acute CVA (cerebrovascular accident) ([place]) 10/02/2018   Left arm numbness 10/01/2018   Left arm weakness 10/01/2018   TIA (transient ischemic attack) 10/01/2018   Nonadherence to medication    History of deep vein thrombosis (DVT) of lower extremity    History of cerebellar stroke    heavy periods 11/06/2011    Santiaga Butzin, [place], PT 07/18/2021, 4:03 PM  [facility] [address] [place], [place], [address] Phone: [phone]   Fax:  [phone]  Name: JOCELYNN GIOFFRE MRN: [id number] Date of Birth: 05-20-1976

## 2021-07-20 ENCOUNTER — Ambulatory Visit: Payer: Medicaid Other | Admitting: Physical Therapy

## 2021-07-20 ENCOUNTER — Other Ambulatory Visit: Payer: Self-pay

## 2021-07-20 DIAGNOSIS — M25512 Pain in left shoulder: Secondary | ICD-10-CM

## 2021-07-20 DIAGNOSIS — M6281 Muscle weakness (generalized): Secondary | ICD-10-CM

## 2021-07-20 DIAGNOSIS — M25612 Stiffness of left shoulder, not elsewhere classified: Secondary | ICD-10-CM

## 2021-07-20 NOTE — Therapy (Signed)
[facility] [place], [place], [address] Phone: [phone]   Fax:  [phone]  Physical Therapy Treatment  Patient Details  Name: Kelly Torres MRN: [id number] Date of Birth: 17-Mar-1976 Referring Provider (PT): [facility] PA-C.   Encounter Date: 07/20/2021   PT End of Session - 07/20/21 1522     Visit Number 7    Number of Visits 12    Date for PT Re-Evaluation 07/20/21    PT Start Time 0232    PT Stop Time 0317    PT Time Calculation (min) 45 min    Activity Tolerance Patient tolerated treatment well    Behavior During Therapy [facility] for tasks assessed/performed             Past Medical History:  Diagnosis Date   Anemia    Arthritis    back   Chronic headaches    Constipation - functional    DVT (deep venous thrombosis) (HCC)    GERD (gastroesophageal reflux disease)    Hernia, abdominal    Hx of migraines    last 08/2011   Hypercholesterolemia    Late effects of cerebrovascular disease 2016   Low back pain    Lumbar herniated disc    pain radiates down right leg   Menometrorrhagia    Sciatica of right side    Spinal headache    Stroke ([place]) 2015    Past Surgical History:  Procedure Laterality Date   ANTERIOR CRUCIATE LIGAMENT REPAIR  2001   right knee, Dr. Aline Brochure, [place]  2002, 2005   [place]   ENDOMETRIAL ABLATION     LUMBAR LAMINECTOMY/DECOMPRESSION MICRODISCECTOMY  01/10/2012   Procedure: LUMBAR LAMINECTOMY/DECOMPRESSION MICRODISCECTOMY 1 LEVEL;  Surgeon: Ophelia Charter, MD;  Location: [place] NEURO ORS;  Service: Neurosurgery;  Laterality: Right;  RIGHT Lumbar Four-Five Diskectomy   LUMBAR LAMINECTOMY/DECOMPRESSION MICRODISCECTOMY  06/18/2012   Procedure: LUMBAR LAMINECTOMY/DECOMPRESSION MICRODISCECTOMY 1 LEVEL;  Surgeon: Ophelia Charter, MD;  Location: [place] NEURO ORS;  Service: Neurosurgery;  Laterality: Right;  Redo Right Lumbar Four-Five Diskectomy   TONSILLECTOMY  age  3    There were no vitals filed for this visit.   Subjective Assessment - 07/20/21 1438     Subjective COVID-19 screen performed prior to patient entering clinic.    Pertinent History Breast cancer (patient states she is considered "cancer-free" at this time, OA, h/o LBP, ACL reconstruction, lumbar surgery.    How long can you sit comfortably? Unlimited.    Patient Stated Goals Use left arm without pain.    Currently in Pain? Yes    Pain Score 4     Pain Location Shoulder    Pain Orientation Left    Pain Onset More than a month ago                               [facility] Adult PT Treatment/Exercise - 07/20/21 0001       Shoulder Exercises: Seated   Other Seated Exercises Seated blue XTS lat PD to fatigue.      Shoulder Exercises: Standing   Other Standing Exercises Standing blue XTS punches.  RW4 with red theraband to fatigue all motions.      Shoulder Exercises: Pulleys   Flexion 5 minutes    Other Pulley Exercises Wall ladder x 3 minutes f/b UE ranger x 3 minutes.      Shoulder Exercises: ROM/Strengthening  UBE (Upper Arm Bike) 10 minutes.      Manual Therapy   Manual Therapy Passive ROM    Passive ROM In supine:  PROM x 10 minutes.                          PT Long Term Goals - 06/08/21 1340       PT LONG TERM GOAL #1   Title Independent with a HEP.    Baseline No knowledge of appropriate ther ex.    Time 6    Period Weeks    Status New      PT LONG TERM GOAL #2   Title Patient able to reach behind back with left hand to fasten/unfasten bar.    Baseline Unable to perform.    Time 6    Period Weeks    Status New      PT LONG TERM GOAL #3   Title Increase left shoulder strength to a solid 5/5 to increase stability for performance of functional activities.    Baseline Left shoulder weak especially into flexion and abduction (3+ to 4-/5).    Time 6    Period Weeks    Status New      PT LONG TERM GOAL #4   Title Perform  ADLs with pain not > 3/10.    Baseline Perform ADL's and certain left shoulder motions can can pain-levels to reach near severe levels.    Time 6    Period Weeks    Status New                   Plan - 07/20/21 1518     Clinical Impression Statement Patient did great today.  Added blue XTS band exercises today without complaint.    Personal Factors and Comorbidities Other;Comorbidity 1    Comorbidities Breast cancer (patient states she is considered "cancer-free" at this time, OA, h/o LBP, ACL reconstruction, lumbar surgery.    Examination-Participation Restrictions Other    Stability/Clinical Decision Making Stable/Uncomplicated    Rehab Potential Excellent    PT Frequency 2x / week    PT Duration 6 weeks    PT Treatment/Interventions ADLs/Self Care Home Management;Manual techniques;Therapeutic exercise;Therapeutic activities    PT Next Visit Plan UBE, supine cane exercises, UE Ranger, RW4, AA and AROM.  STW/M.    Consulted and Agree with Plan of Care Patient             Patient will benefit from skilled therapeutic intervention in order to improve the following deficits and impairments:  Pain, Decreased activity tolerance, Decreased strength, Decreased range of motion, Increased muscle spasms  Visit Diagnosis: Stiffness of left shoulder, not elsewhere classified  Acute pain of left shoulder  Muscle weakness (generalized)     Problem List Patient Active Problem List   Diagnosis Date Noted   Acute CVA (cerebrovascular accident) ([place]) 10/02/2018   Left arm numbness 10/01/2018   Left arm weakness 10/01/2018   TIA (transient ischemic attack) 10/01/2018   Nonadherence to medication    History of deep vein thrombosis (DVT) of lower extremity    History of cerebellar stroke    heavy periods 11/06/2011    Andelyn Spade, [place], PT 07/20/2021, 3:59 PM  [facility] [address] [place], [place], [address] Phone:  [phone]   Fax:  [phone]  Name: Kelly Torres MRN: [id number] Date of Birth: [birth year]

## 2021-07-25 ENCOUNTER — Ambulatory Visit: Payer: Medicaid Other

## 2021-07-27 ENCOUNTER — Encounter: Payer: Medicaid Other | Admitting: *Deleted

## 2021-09-28 ENCOUNTER — Other Ambulatory Visit: Payer: Self-pay

## 2021-09-28 ENCOUNTER — Emergency Department (HOSPITAL_COMMUNITY): Payer: Medicaid Other

## 2021-09-28 ENCOUNTER — Encounter (HOSPITAL_COMMUNITY): Payer: Self-pay | Admitting: *Deleted

## 2021-09-28 ENCOUNTER — Emergency Department (HOSPITAL_COMMUNITY)
Admission: EM | Admit: 2021-09-28 | Discharge: 2021-09-28 | Disposition: A | Discharge status: Home / Self Care | Payer: Medicaid Other | Attending: Emergency Medicine | Admitting: Emergency Medicine

## 2021-09-28 DIAGNOSIS — Z853 Personal history of malignant neoplasm of breast: Secondary | ICD-10-CM | POA: Insufficient documentation

## 2021-09-28 DIAGNOSIS — R350 Frequency of micturition: Secondary | ICD-10-CM | POA: Insufficient documentation

## 2021-09-28 DIAGNOSIS — R197 Diarrhea, unspecified: Secondary | ICD-10-CM | POA: Insufficient documentation

## 2021-09-28 DIAGNOSIS — Z7982 Long term (current) use of aspirin: Secondary | ICD-10-CM | POA: Diagnosis not present

## 2021-09-28 DIAGNOSIS — R1031 Right lower quadrant pain: Secondary | ICD-10-CM | POA: Diagnosis not present

## 2021-09-28 DIAGNOSIS — D649 Anemia, unspecified: Secondary | ICD-10-CM | POA: Diagnosis not present

## 2021-09-28 DIAGNOSIS — R1011 Right upper quadrant pain: Secondary | ICD-10-CM | POA: Diagnosis present

## 2021-09-28 DIAGNOSIS — R112 Nausea with vomiting, unspecified: Secondary | ICD-10-CM | POA: Insufficient documentation

## 2021-09-28 DIAGNOSIS — Z91040 Latex allergy status: Secondary | ICD-10-CM | POA: Diagnosis not present

## 2021-09-28 DIAGNOSIS — R509 Fever, unspecified: Secondary | ICD-10-CM | POA: Diagnosis not present

## 2021-09-28 DIAGNOSIS — C50919 Malignant neoplasm of unspecified site of unspecified female breast: Secondary | ICD-10-CM

## 2021-09-28 LAB — COMPREHENSIVE METABOLIC PANEL
ALT: 81 U/L — ABNORMAL HIGH (ref 0–44)
AST: 81 U/L — ABNORMAL HIGH (ref 15–41)
Albumin: 2.3 g/dL — ABNORMAL LOW (ref 3.5–5.0)
Alkaline Phosphatase: 83 U/L (ref 38–126)
Anion gap: 10 (ref 5–15)
BUN: 15 mg/dL (ref 6–20)
CO2: 29 mmol/L (ref 22–32)
Calcium: 7.8 mg/dL — ABNORMAL LOW (ref 8.9–10.3)
Chloride: 99 mmol/L (ref 98–111)
Creatinine, Ser: 0.91 mg/dL (ref 0.44–1.00)
GFR, Estimated: >60 mL/min (ref >60)
Glucose, Bld: 121 mg/dL — ABNORMAL HIGH (ref 70–99)
Potassium: 3.5 mmol/L (ref 3.5–5.1)
Sodium: 138 mmol/L (ref 135–145)
Total Bilirubin: 1.4 mg/dL — ABNORMAL HIGH (ref 0.3–1.2)
Total Protein: 6.8 g/dL (ref 6.5–8.1)

## 2021-09-28 LAB — URINALYSIS, ROUTINE W REFLEX MICROSCOPIC
Bacteria, UA: NONE SEEN
Bilirubin Urine: NEGATIVE
Glucose, UA: NEGATIVE mg/dL
Ketones, ur: NEGATIVE mg/dL
Nitrite: NEGATIVE
Protein, ur: 100 mg/dL — AB
Specific Gravity, Urine: 1.027 (ref 1.005–1.030)
pH: 5 (ref 5.0–8.0)

## 2021-09-28 LAB — CBC
HCT: 31.6 % — ABNORMAL LOW (ref 36.0–46.0)
Hemoglobin: 10 g/dL — ABNORMAL LOW (ref 12.0–15.0)
MCH: 29.4 pg (ref 26.0–34.0)
MCHC: 31.6 g/dL (ref 30.0–36.0)
MCV: 92.9 fL (ref 80.0–100.0)
RBC: 3.4 MIL/uL — ABNORMAL LOW (ref 3.87–5.11)
RDW: 14.7 % (ref 11.5–15.5)
WBC: 9.2 10*3/uL (ref 4.0–10.5)
nRBC: 0 % (ref 0.0–0.2)

## 2021-09-28 LAB — LIPASE, BLOOD: Lipase: 21 U/L (ref 11–51)

## 2021-09-28 MED ORDER — FENTANYL CITRATE PF 50 MCG/ML IJ SOSY
50.0000 ug | PREFILLED_SYRINGE | Freq: Once | INTRAMUSCULAR | Status: AC
  Administered 2021-09-28: 50 ug via INTRAVENOUS
  Filled 2021-09-28: qty 1

## 2021-09-28 MED ORDER — IOHEXOL 300 MG/ML  SOLN
100.0000 mL | Freq: Once | INTRAMUSCULAR | Status: AC | PRN
Start: 2021-09-28 — End: 2021-09-28
  Administered 2021-09-28: 100 mL via INTRAVENOUS

## 2021-09-28 NOTE — ED Triage Notes (Signed)
Pt c/o right lower abdominal pain that started yesterday; pt having vomiting and diarrhea ?

## 2021-09-28 NOTE — ED Notes (Signed)
Pt back from CT

## 2021-09-28 NOTE — Discharge Instructions (Signed)
Please follow-up with your primary care doctor and oncology team next week for further evaluation.  It is very important that you follow-up with them. ? ?Please return to the emergency department for any worsening symptoms you might have. ?

## 2021-09-28 NOTE — ED Provider Notes (Signed)
?[facility] ?Provider Note ? ? ?CSN: [id number] ?Arrival date & time: 09/28/21  1134 ? ?  ? ?History ?Chief Complaint  ?Patient presents with  ? Abdominal Pain  ? ? ?Kelly Torres is a 46 y.o. female with no surgical history of the abdomen who presents the emergency department with right lower and upper quadrant abdominal pains been ongoing for the last 2 days.  She states this is a sharp sensation that is worse with respirations.  She does report associated nausea, vomiting, and diarrhea but she has had a "GI bug" 2 days prior.  Patient did have vomiting and diarrhea this morning.  She also reports associated subjective fever, urinary frequency.  She denies any chest pain or shortness of breath.  She denies any history of kidney stones.  No family history of kidney stones. ? ? ?Abdominal Pain ? ?  ? ?Home Medications ?Prior to Admission medications   ?Medication Sig Start Date End Date Taking? Authorizing Provider  ?acetaminophen (TYLENOL) 500 MG tablet Take 1,000 mg by mouth every 6 (six) hours as needed.    [provider]  ?aspirin 325 MG tablet Take 1 tablet (325 mg total) by mouth daily. ?Patient not taking: Reported on 06/08/2021 10/04/18   Murlean Iba, MD  ?atorvastatin (LIPITOR) 40 MG tablet Take 1 tablet (40 mg total) by mouth daily at 6 PM for 30 days. 10/03/18 11/02/18  Murlean Iba, MD  ?cyclobenzaprine (FLEXERIL) 10 MG tablet Take 1 tablet by mouth at bedtime as needed. 01/20/18   [provider]  ?diclofenac sodium (VOLTAREN) 1 % GEL Apply 2 g topically 2 (two) times daily.    [provider]  ?omeprazole (PRILOSEC) 40 MG capsule Take 40 mg by mouth daily.      [provider]  ?   ? ?Allergies    ?Latex   ? ?Review of Systems   ?Review of Systems  ?Gastrointestinal:  Positive for abdominal pain.  ?All other systems reviewed and are negative. ? ?Physical Exam ?Updated Vital Signs ?BP 129/68   Pulse 100   Temp 99 ?F (37.2 ?C) (Oral)    Resp 17   Ht 6' (1.829 m)   Wt (!) 156.9 kg   SpO2 100%   BMI 46.93 kg/m?  ?Physical Exam ?Vitals and nursing note reviewed.  ?Constitutional:   ?   General: She is not in acute distress. ?   Appearance: Normal appearance.  ?HENT:  ?   Head: Normocephalic and atraumatic.  ?Eyes:  ?   General:     ?   Right eye: No discharge.     ?   Left eye: No discharge.  ?Cardiovascular:  ?   Comments: Regular rate and rhythm.  S1/S2 are distinct without any evidence of murmur, rubs, or gallops.  Radial pulses are 2+ bilaterally.  Dorsalis pedis pulses are 2+ bilaterally.  No evidence of pedal edema. ?Pulmonary:  ?   Comments: Clear to auscultation bilaterally.  Normal effort.  No respiratory distress.  No evidence of wheezes, rales, or rhonchi heard throughout. ?Abdominal:  ?   General: Bowel sounds are normal. There is no distension.  ?   Palpations: Abdomen is soft.  ?   Tenderness: There is abdominal tenderness in the right upper quadrant and right lower quadrant. There is no guarding or rebound.  ?   Comments: Obese abdomen  ?Musculoskeletal:     ?   General: Normal range of motion.  ?   Cervical  back: Neck supple.  ?Skin: ?   General: Skin is warm and dry.  ?   Findings: No rash.  ?Neurological:  ?   General: No focal deficit present.  ?   Mental Status: She is alert.  ?Psychiatric:     ?   Mood and Affect: Mood normal.     ?   Behavior: Behavior normal.  ? ? ?ED Results / Procedures / Treatments   ?Labs ?(all labs ordered are listed, but only abnormal results are displayed) ?Labs Reviewed  ?COMPREHENSIVE METABOLIC PANEL - Abnormal; Notable for the following components:  ?    Result Value  ? Glucose, Bld 121 (*)   ? Calcium 7.8 (*)   ? Albumin 2.3 (*)   ? AST 81 (*)   ? ALT 81 (*)   ? Total Bilirubin 1.4 (*)   ? All other components within normal limits  ?CBC - Abnormal; Notable for the following components:  ? RBC 3.40 (*)   ? Hemoglobin 10.0 (*)   ? HCT 31.6 (*)   ? All other components within normal limits   ?URINALYSIS, ROUTINE W REFLEX MICROSCOPIC - Abnormal; Notable for the following components:  ? Color, Urine AMBER (*)   ? APPearance HAZY (*)   ? Hgb urine dipstick SMALL (*)   ? Protein, ur 100 (*)   ? Leukocytes,Ua TRACE (*)   ? All other components within normal limits  ?URINE CULTURE  ?LIPASE, BLOOD  ? ? ?EKG ?None ? ?Radiology ?CT ABDOMEN PELVIS W CONTRAST ? ?Result Date: 09/28/2021 ?CLINICAL DATA:  Pain right lower quadrant vomiting diarrhea EXAM: CT ABDOMEN AND PELVIS WITH CONTRAST TECHNIQUE: Multidetector CT imaging of the abdomen and pelvis was performed using the standard protocol following bolus administration of intravenous contrast. RADIATION DOSE REDUCTION: This exam was performed according to the departmental dose-optimization program which includes automated exposure control, adjustment of the mA and/or kV according to patient size and/or use of iterative reconstruction technique. CONTRAST:  166m OMNIPAQUE IOHEXOL 300 MG/ML  SOLN COMPARISON:  None. FINDINGS: Lower chest: Unremarkable. Hepatobiliary: Liver measures 23.7 cm in length. There are numerous low-density lesions of varying sizes scattered throughout the liver. Largest of the lesions measures 2 cm. There is no dilation of bile ducts. There is subtle periportal edema adjacent to the portal vein branches. Gallbladder is distended. There is minimal amount of fluid adjacent to the gallbladder. There is no significant wall thickening in gallbladder. Pancreas: No focal abnormality is seen. Spleen: Spleen measures 13.1 cm in maximum diameter. Adrenals/Urinary Tract: Adrenals are unremarkable. There is no hydronephrosis. There are patchy areas of decreased enhancement in the left renal cortex in the mid and lower portions. There are no renal or ureteral stones. Urinary bladder is not distended. There is possible 10 mm cyst in the upper pole of right kidney. There is possible 4 mm cyst in the lower pole of right kidney. There is 2.1 cm cyst in the  lower pole of right kidney. Stomach/Bowel: Small hiatal hernia is seen. Stomach is not distended. Small bowel loops are not dilated. Appendix is not dilated. There are high density foci in the lumen of right colon and appendix, possibly oral medication. There is no significant wall thickening in colon. There is no pericolic stranding. Vascular/Lymphatic: Vascular structures are unremarkable. There are enlarged lymph nodes in the retroperitoneum largest measuring 9 mm in short axis in the left para-aortic location. Reproductive: Unremarkable. Other: There is no ascites or pneumoperitoneum. There is large umbilical hernia containing  fat. There is 4.4 cm smooth marginated structure in the left breast. There is skin thickening in the left breast. Musculoskeletal: Degenerative changes are noted with partial fusion of bodies of L4 and L5 vertebrae along with significant encroachment of neural foramina. IMPRESSION: There are numerous low-attenuation space-occupying lesions of varying sizes scattered throughout the liver suggesting possible hepatic metastatic disease. There are patchy foci of decreased enhancement in the mid and lower portions of left kidney. Please correlate for possible acute pyelonephritis. There is no hydronephrosis. There is no evidence of intestinal obstruction or pneumoperitoneum. Appendix is not dilated. Small high density foci in the lumen of right colon and lumen of appendix may suggest oral medication. There are enlarged lymph nodes in the retroperitoneum each measuring less than 9 mm in short axis suggesting reactive hyperplasia. Possibility of early infectious or neoplastic process in the lymph nodes is not excluded. There is 4.4 cm fluid density lesion in the left breast which may be related to previous intervention or necrotic neoplasm. Less likely possibility would be infectious process such as an abscess. There is abnormal skin thickening in the left breast possibly due to previous  intervention such as radiation treatment. There is mild Periportal edema in the liver. Small amount of fluid is noted adjacent to the gallbladder. These findings are nonspecific. This may be due to inflammation. If there is c

## 2021-09-30 LAB — URINE CULTURE

## 2024-03-05 IMAGING — CT CT ABD-PELV W/ CM
2 of 5 series · 15 of 46 positions shown, 17 images · IV contrast (Omnipaque or Isovue)
Comparison: None.

CLINICAL DATA: Pain right lower quadrant vomiting diarrhea

EXAM:
CT ABDOMEN AND PELVIS WITH CONTRAST
TECHNIQUE: Multidetector CT imaging of the abdomen and pelvis was performed
using the standard protocol following bolus administration of
intravenous contrast.

[Series 2: axial st · axial · 0.88mm/px · z∈[+964,+1419]mm · 12 of 105 slices shown, 14 images]
[im 7/105  soft-tissue]
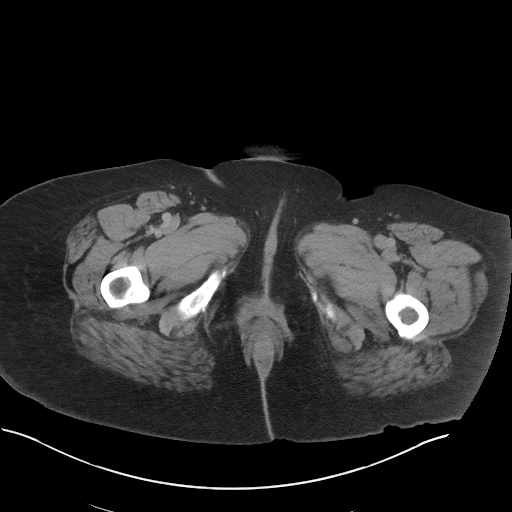
[im 7/105  bone]
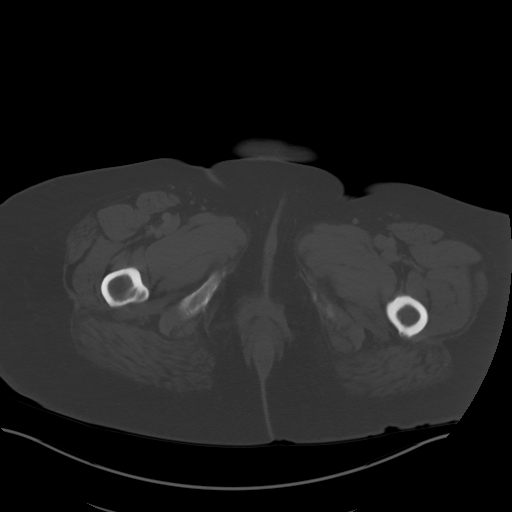
[im 14/105  soft-tissue]
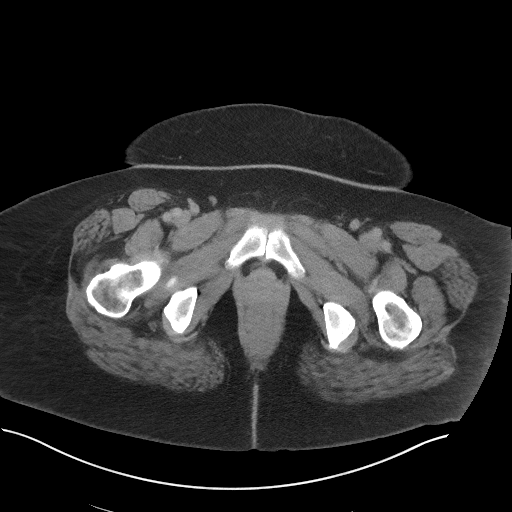
[im 27/105  soft-tissue]
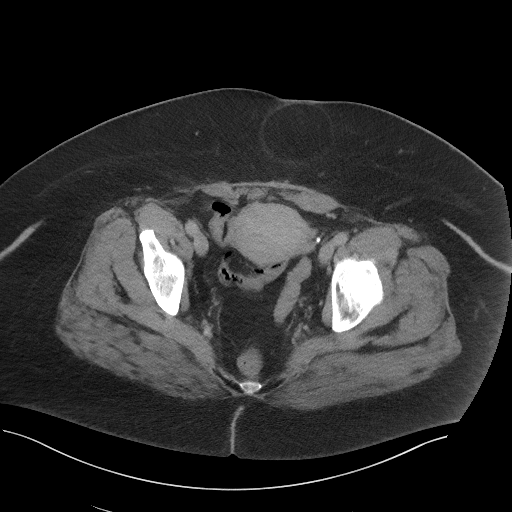
[im 33/105  soft-tissue]
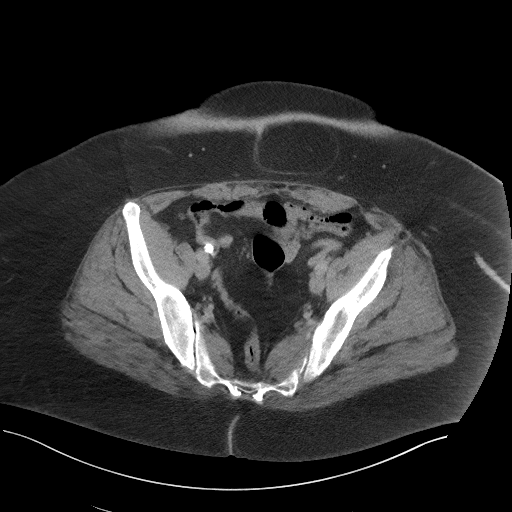
[im 40/105  soft-tissue]
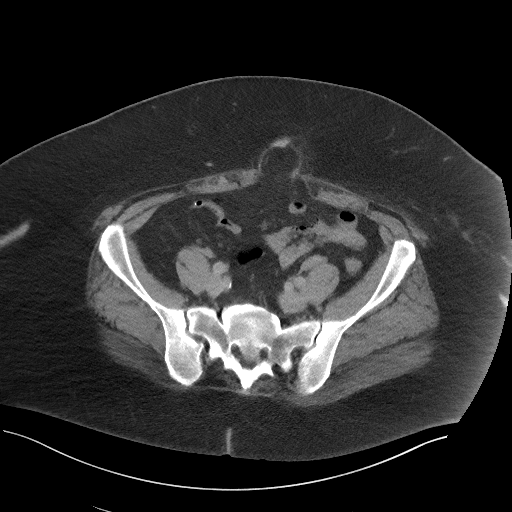
[im 46/105  soft-tissue]
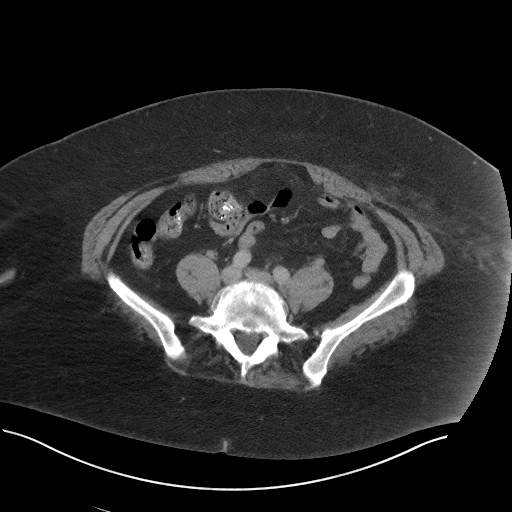
[im 59/105  soft-tissue]
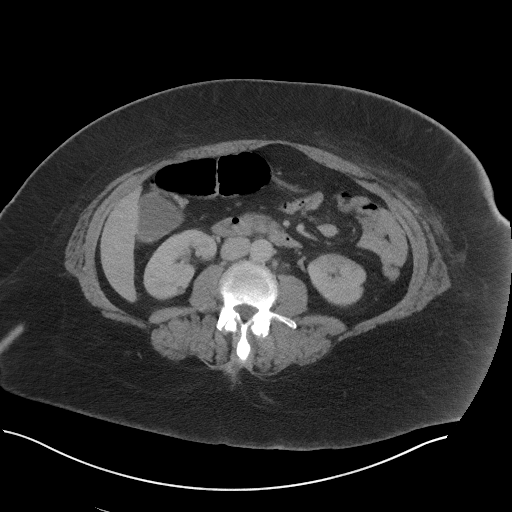
[im 66/105  soft-tissue]
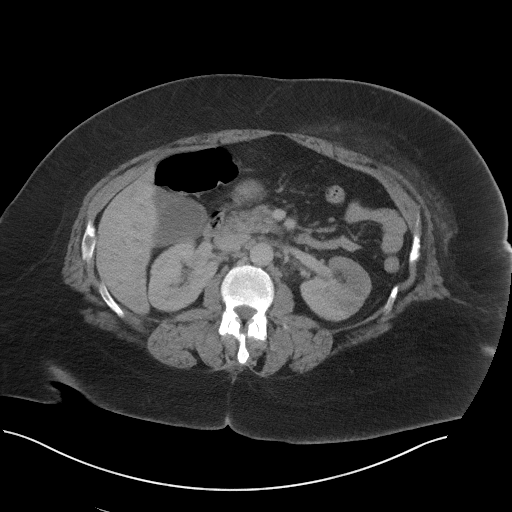
[im 72/105  soft-tissue]
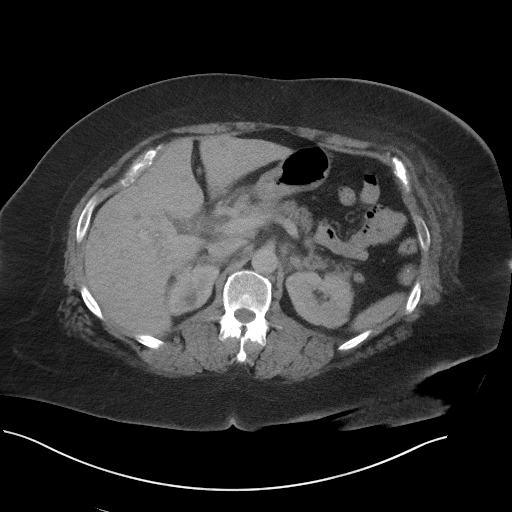
[im 72/105  bone]
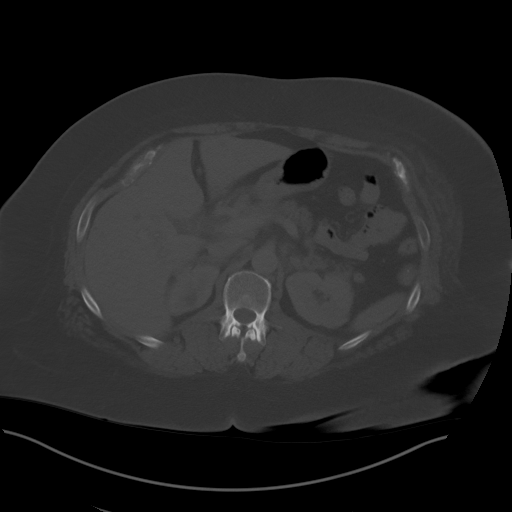
[im 79/105  soft-tissue]
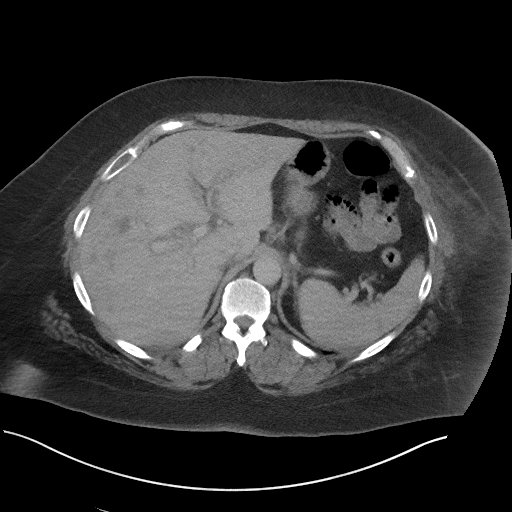
[im 92/105  soft-tissue]
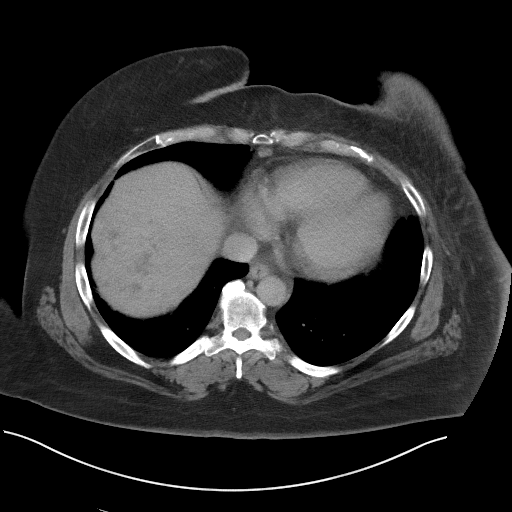
[im 98/105  soft-tissue]
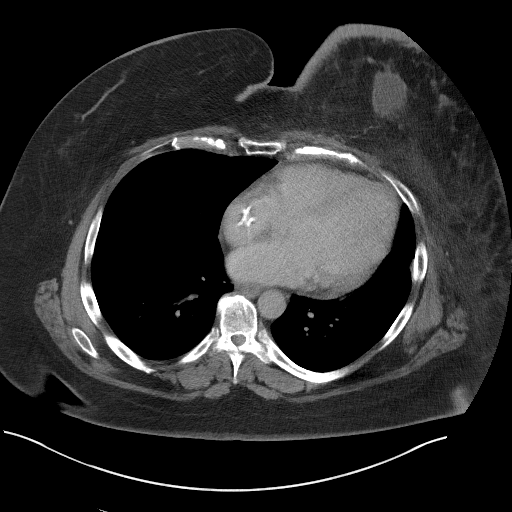

[Series 6: coronal st · coronal · 0.91mm/px · 3 of 117 slices shown]
[im 39/117  soft-tissue]
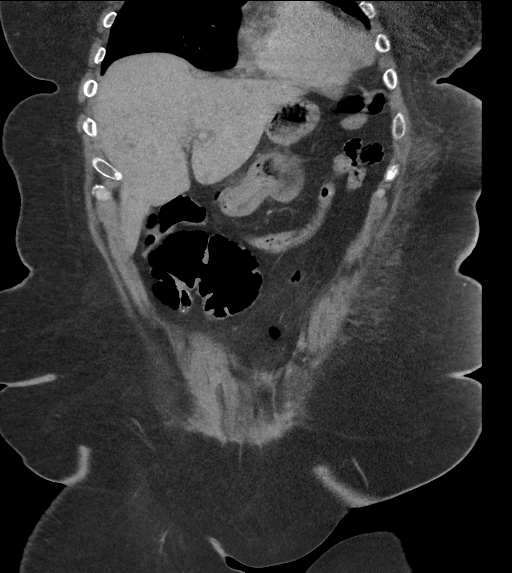
[im 52/117  soft-tissue]
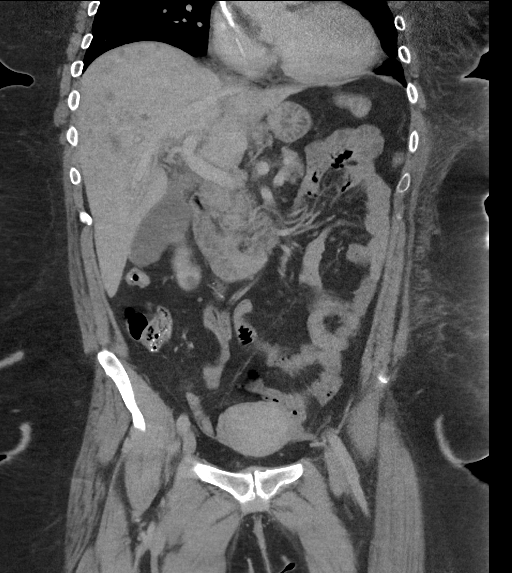
[im 65/117  soft-tissue]
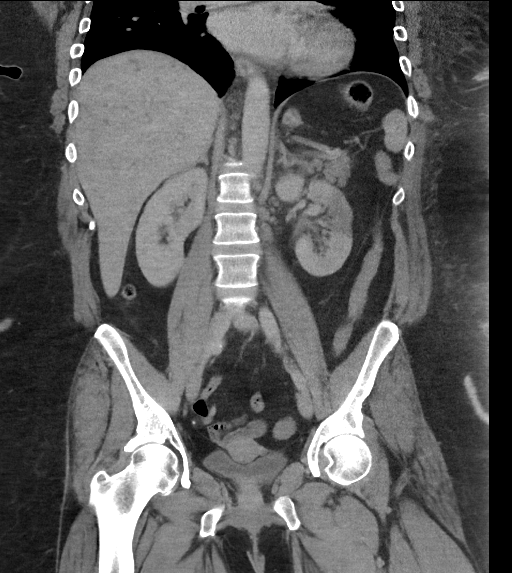

[15 of 46 positions shown; findings below may reference images not displayed]

RADIATION DOSE REDUCTION: This exam was performed according to the
departmental dose-optimization program which includes automated
exposure control, adjustment of the mA and/or kV according to
patient size and/or use of iterative reconstruction technique.

CONTRAST:  100mL OMNIPAQUE IOHEXOL 300 MG/ML  SOLN
FINDINGS: Lower chest: Unremarkable.

Hepatobiliary: Liver measures 23.7 cm in length. There are numerous
low-density lesions of varying sizes scattered throughout the liver.
Largest of the lesions measures 2 cm. There is no dilation of bile
ducts. There is subtle periportal edema adjacent to the portal vein
branches. Gallbladder is distended. There is minimal amount of fluid
adjacent to the gallbladder. There is no significant wall thickening
in gallbladder.

Pancreas: No focal abnormality is seen.

Spleen: Spleen measures 13.1 cm in maximum diameter.

Adrenals/Urinary Tract: Adrenals are unremarkable. There is no
hydronephrosis. There are patchy areas of decreased enhancement in
the left renal cortex in the mid and lower portions. There are no
renal or ureteral stones. Urinary bladder is not distended. There is
possible 10 mm cyst in the upper pole of right kidney. There is
possible 4 mm cyst in the lower pole of right kidney. There is
cm cyst in the lower pole of right kidney.

Stomach/Bowel: Small hiatal hernia is seen. Stomach is not
distended. Small bowel loops are not dilated. Appendix is not
dilated. There are high density foci in the lumen of right colon and
appendix, possibly oral medication. There is no significant wall
thickening in colon. There is no pericolic stranding.

Vascular/Lymphatic: Vascular structures are unremarkable. There are
enlarged lymph nodes in the retroperitoneum largest measuring 9 mm
in short axis in the left para-aortic location.

Reproductive: Unremarkable.

Other: There is no ascites or pneumoperitoneum. There is large
umbilical hernia containing fat. There is 4.4 cm smooth marginated
structure in the left breast. There is skin thickening in the left
breast.

Musculoskeletal: Degenerative changes are noted with partial fusion
of bodies of L4 and L5 vertebrae along with significant encroachment
of neural foramina.
IMPRESSION: There are numerous low-attenuation space-occupying lesions of
varying sizes scattered throughout the liver suggesting possible
hepatic metastatic disease.

There are patchy foci of decreased enhancement in the mid and lower
portions of left kidney. Please correlate for possible acute
pyelonephritis. There is no hydronephrosis.

There is no evidence of intestinal obstruction or pneumoperitoneum.
Appendix is not dilated. Small high density foci in the lumen of
right colon and lumen of appendix may suggest oral medication.

There are enlarged lymph nodes in the retroperitoneum each measuring
less than 9 mm in short axis suggesting reactive hyperplasia.
Possibility of early infectious or neoplastic process in the lymph
nodes is not excluded.

There is 4.4 cm fluid density lesion in the left breast which may be
related to previous intervention or necrotic neoplasm. Less likely
possibility would be infectious process such as an abscess. There is
abnormal skin thickening in the left breast possibly due to previous
intervention such as radiation treatment.

There is mild Periportal edema in the liver. Small amount of fluid
is noted adjacent to the gallbladder. These findings are
nonspecific. This may be due to inflammation. If there is clinical
suspicion for acute cholecystitis, gallbladder sonogram may be
considered.

Large umbilical hernia containing fat.  Renal cysts.

Other findings as described in the body of the report.

## 2024-03-05 IMAGING — US US ABDOMEN LIMITED
1 series · 14 of 25 positions shown · non-contrast
Comparison: Today's CT.

CLINICAL DATA: Right upper quadrant pain for 3 days with nausea and
vomiting.

EXAM:
ULTRASOUND ABDOMEN LIMITED RIGHT UPPER QUADRANT

[Series 1: us abdomen limited ruq (liver/gb) · 14 of 62 slices shown]
[im 1/62]
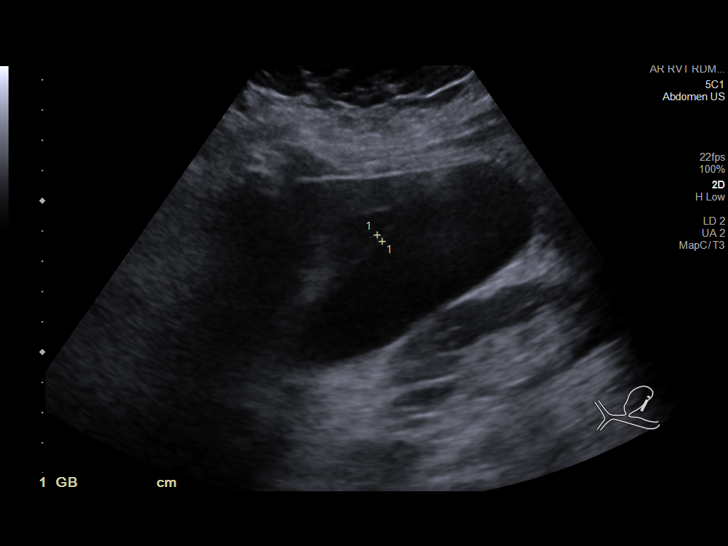
[im 6/62]
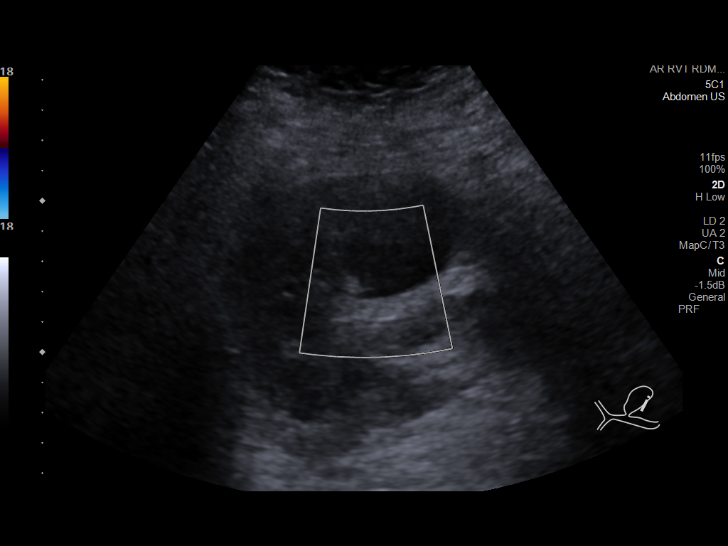
[im 11/62]
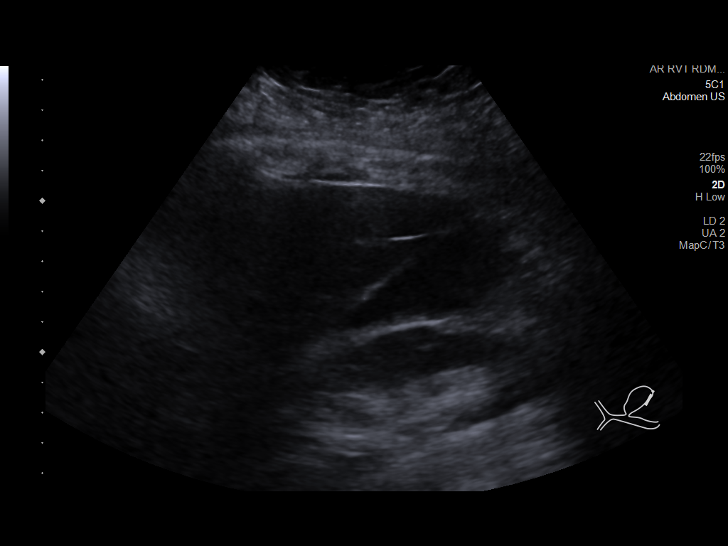
[im 16/62]
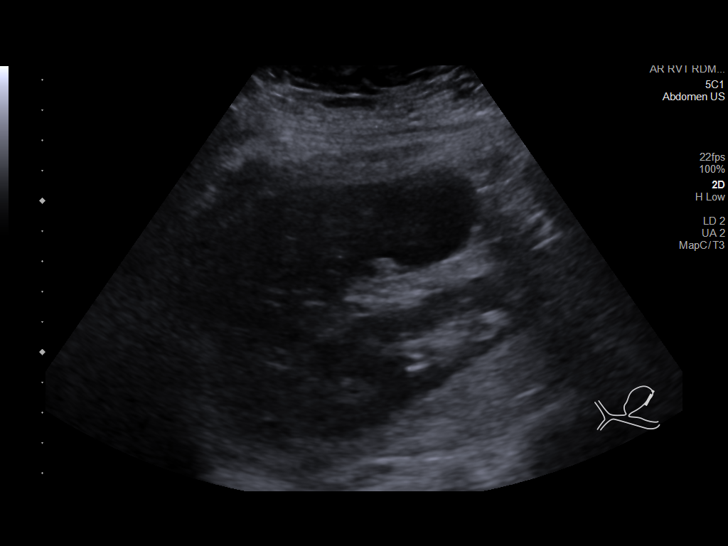
[im 21/62]
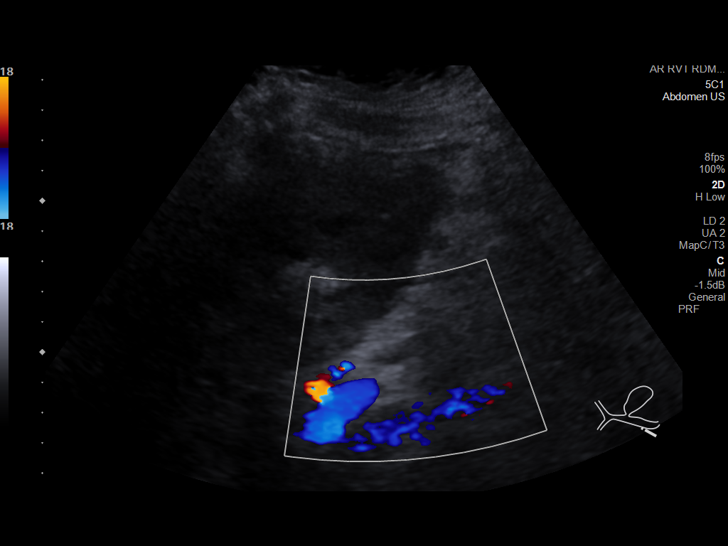
[im 23/62]
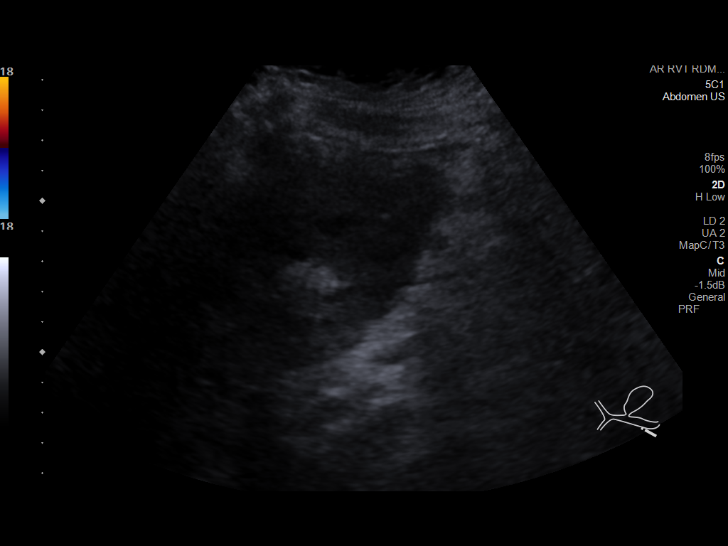
[im 28/62]
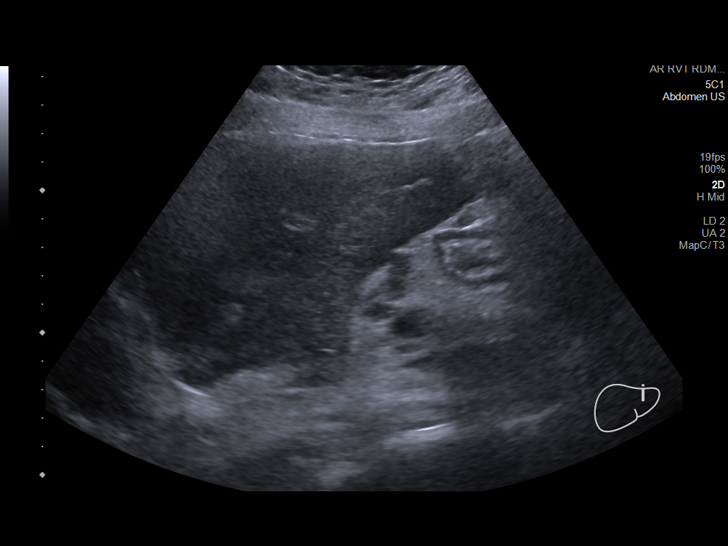
[im 34/62]
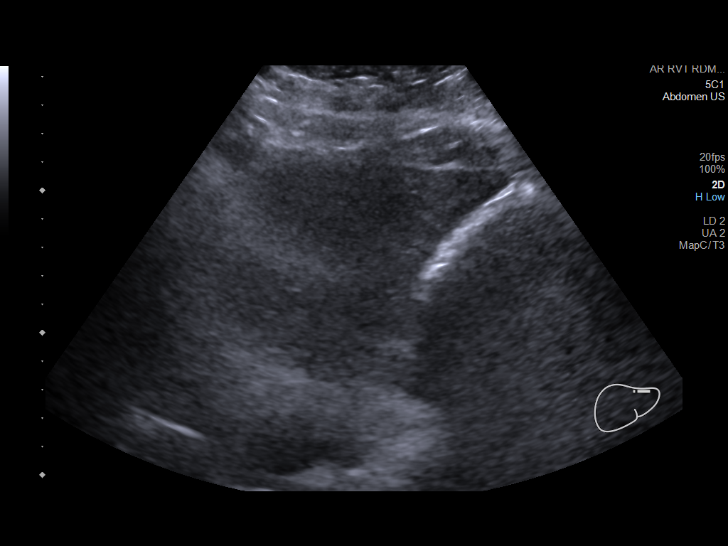
[im 39/62]
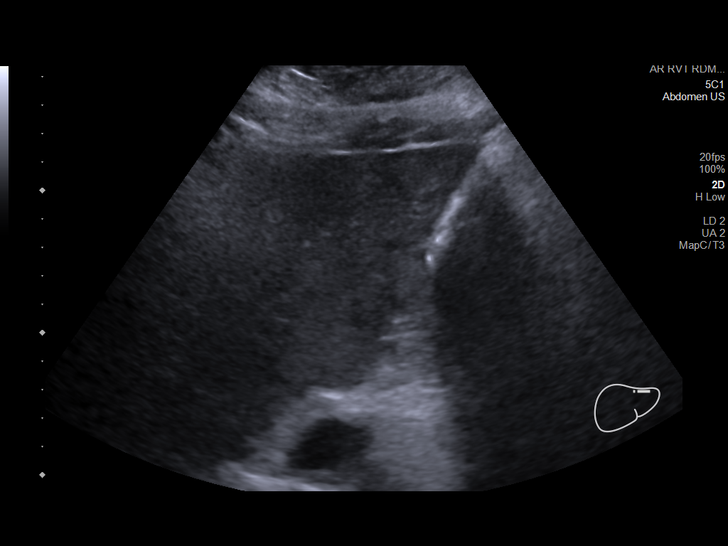
[im 41/62]
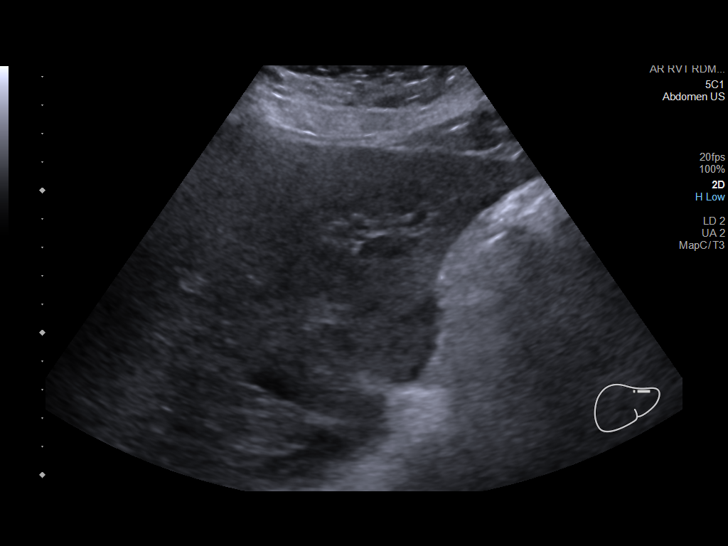
[im 46/62]
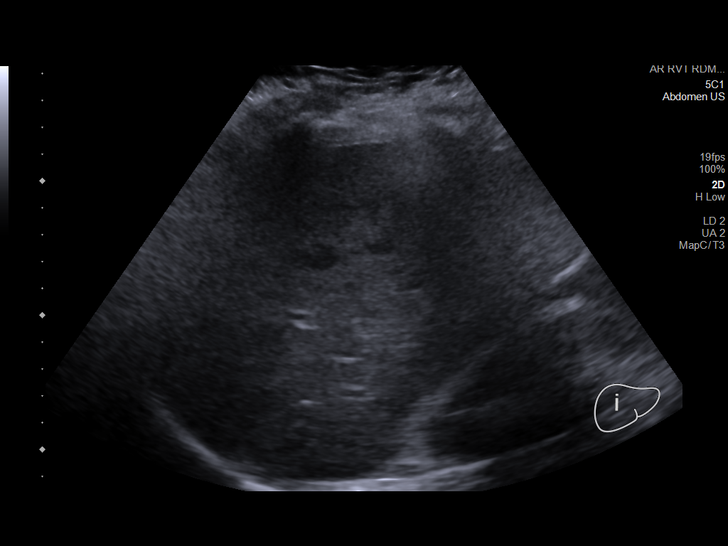
[im 51/62]
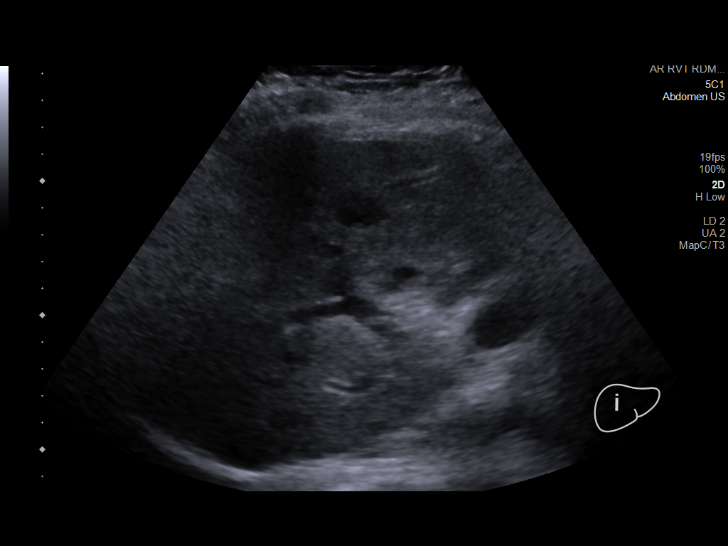
[im 56/62]
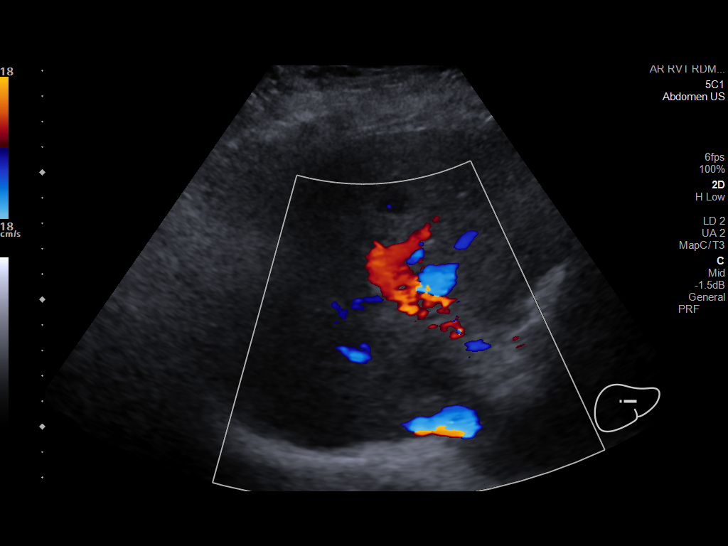
[im 62/62]
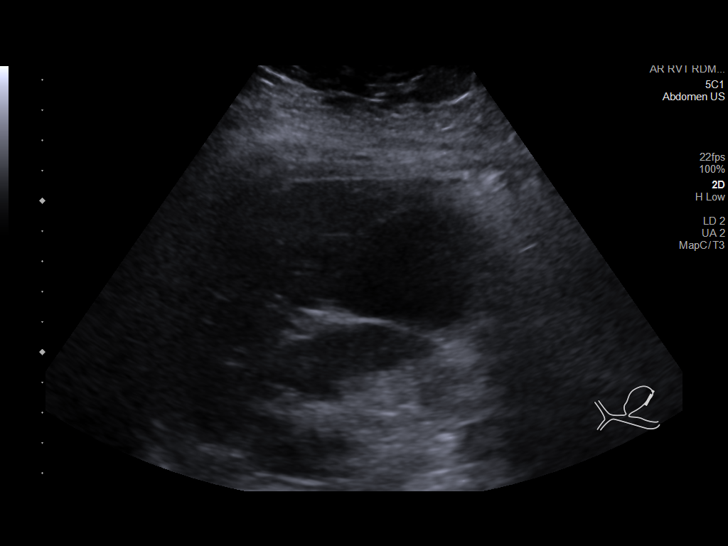

[14 of 25 positions shown; findings below may reference images not displayed]

FINDINGS: Gallbladder:

8 mm echogenic focus within the gallbladder is favored to represent
a polyp. Otherwise, no gallstones or sludge. No wall thickening or
pericholecystic fluid. The technologist describes tenderness with
gallbladder palpation.

Common bile duct:

Diameter: Borderline enlarged at 7 mm. No obstructive stone or mass
on today's CT.

Liver:

Multiple hepatic lesions, highly suspicious for metastasis. Examples
at up to 2.3 cm in the left hepatic lobe. Portal vein is patent on
color Doppler imaging with normal direction of blood flow towards
the liver.

Other: None.
IMPRESSION: 8 mm echogenic focus in the gallbladder is favored to represent a
polyp. Adherent nonshadowing stone felt less likely. Although there
is no specific evidence of acute cholecystitis, technologist
describes tenderness with gallbladder palpation, nonspecific.

Multiple liver lesions, highly suspicious for metastatic disease in
this patient with a history of breast cancer.
